# Patient Record
Sex: Female | Born: 1965 | Race: Black or African American | Hispanic: No | Smoking: Never smoker
Health system: Southern US, Community
[De-identification: ages and names within clinical notes are randomized; demographics above are authoritative.]

## PROBLEM LIST (undated history)

## (undated) DIAGNOSIS — N289 Disorder of kidney and ureter, unspecified: Secondary | ICD-10-CM

## (undated) DIAGNOSIS — I10 Essential (primary) hypertension: Secondary | ICD-10-CM

## (undated) DIAGNOSIS — J181 Lobar pneumonia, unspecified organism: Secondary | ICD-10-CM

## (undated) DIAGNOSIS — F419 Anxiety disorder, unspecified: Secondary | ICD-10-CM

## (undated) DIAGNOSIS — IMO0002 Reserved for concepts with insufficient information to code with codable children: Secondary | ICD-10-CM

## (undated) DIAGNOSIS — N183 Chronic kidney disease, stage 3 unspecified: Secondary | ICD-10-CM

## (undated) DIAGNOSIS — M5134 Other intervertebral disc degeneration, thoracic region: Secondary | ICD-10-CM

## (undated) DIAGNOSIS — M1990 Unspecified osteoarthritis, unspecified site: Secondary | ICD-10-CM

## (undated) DIAGNOSIS — I509 Heart failure, unspecified: Secondary | ICD-10-CM

## (undated) DIAGNOSIS — J9621 Acute and chronic respiratory failure with hypoxia: Secondary | ICD-10-CM

## (undated) DIAGNOSIS — I639 Cerebral infarction, unspecified: Secondary | ICD-10-CM

## (undated) DIAGNOSIS — I5032 Chronic diastolic (congestive) heart failure: Secondary | ICD-10-CM

## (undated) DIAGNOSIS — J449 Chronic obstructive pulmonary disease, unspecified: Secondary | ICD-10-CM

## (undated) DIAGNOSIS — G629 Polyneuropathy, unspecified: Secondary | ICD-10-CM

## (undated) HISTORY — DX: Chronic diastolic (congestive) heart failure: I50.32

## (undated) HISTORY — DX: Chronic obstructive pulmonary disease, unspecified: J44.9

## (undated) HISTORY — DX: Chronic kidney disease, stage 3 (moderate): N18.3

## (undated) HISTORY — PX: EYE SURGERY: SHX253

## (undated) HISTORY — DX: Acute and chronic respiratory failure with hypoxia: J96.21

## (undated) HISTORY — PX: THYROID SURGERY: SHX805

## (undated) HISTORY — PX: ABDOMINAL HYSTERECTOMY: SHX81

## (undated) HISTORY — PX: OTHER SURGICAL HISTORY: SHX169

## (undated) HISTORY — DX: Lobar pneumonia, unspecified organism: J18.1

## (undated) HISTORY — DX: Chronic kidney disease, stage 3 unspecified: N18.30

## (undated) HISTORY — PX: CHOLECYSTECTOMY: SHX55

---

## 2011-06-01 ENCOUNTER — Emergency Department (HOSPITAL_COMMUNITY)
Admission: EM | Admit: 2011-06-01 | Discharge: 2011-06-01 | Disposition: A | Discharge status: Home / Self Care | Payer: Self-pay | Attending: Emergency Medicine | Admitting: Emergency Medicine

## 2011-06-01 ENCOUNTER — Other Ambulatory Visit: Payer: Self-pay

## 2011-06-01 ENCOUNTER — Emergency Department (HOSPITAL_COMMUNITY): Payer: Self-pay

## 2011-06-01 DIAGNOSIS — J189 Pneumonia, unspecified organism: Secondary | ICD-10-CM | POA: Insufficient documentation

## 2011-06-01 DIAGNOSIS — I10 Essential (primary) hypertension: Secondary | ICD-10-CM | POA: Insufficient documentation

## 2011-06-01 DIAGNOSIS — R609 Edema, unspecified: Secondary | ICD-10-CM | POA: Insufficient documentation

## 2011-06-01 DIAGNOSIS — E119 Type 2 diabetes mellitus without complications: Secondary | ICD-10-CM | POA: Insufficient documentation

## 2011-06-01 DIAGNOSIS — Z9079 Acquired absence of other genital organ(s): Secondary | ICD-10-CM | POA: Insufficient documentation

## 2011-06-01 DIAGNOSIS — IMO0002 Reserved for concepts with insufficient information to code with codable children: Secondary | ICD-10-CM | POA: Insufficient documentation

## 2011-06-01 DIAGNOSIS — R600 Localized edema: Secondary | ICD-10-CM

## 2011-06-01 DIAGNOSIS — Z9889 Other specified postprocedural states: Secondary | ICD-10-CM | POA: Insufficient documentation

## 2011-06-01 DIAGNOSIS — F411 Generalized anxiety disorder: Secondary | ICD-10-CM | POA: Insufficient documentation

## 2011-06-01 DIAGNOSIS — M1990 Unspecified osteoarthritis, unspecified site: Secondary | ICD-10-CM | POA: Insufficient documentation

## 2011-06-01 HISTORY — DX: Polyneuropathy, unspecified: G62.9

## 2011-06-01 HISTORY — DX: Reserved for concepts with insufficient information to code with codable children: IMO0002

## 2011-06-01 HISTORY — DX: Unspecified osteoarthritis, unspecified site: M19.90

## 2011-06-01 HISTORY — DX: Essential (primary) hypertension: I10

## 2011-06-01 HISTORY — DX: Anxiety disorder, unspecified: F41.9

## 2011-06-01 LAB — URINALYSIS, ROUTINE W REFLEX MICROSCOPIC
Glucose, UA: 500 mg/dL — AB
Hgb urine dipstick: NEGATIVE
Ketones, ur: NEGATIVE mg/dL
Protein, ur: NEGATIVE mg/dL

## 2011-06-01 LAB — COMPREHENSIVE METABOLIC PANEL
ALT: 23 U/L (ref 0–35)
CO2: 33 mEq/L — ABNORMAL HIGH (ref 19–32)
Calcium: 9.7 mg/dL (ref 8.4–10.5)
GFR calc Af Amer: 86 mL/min — ABNORMAL LOW (ref >90)
GFR calc non Af Amer: 74 mL/min — ABNORMAL LOW (ref >90)
Glucose, Bld: 360 mg/dL — ABNORMAL HIGH (ref 70–99)
Sodium: 135 mEq/L (ref 135–145)

## 2011-06-01 LAB — DIFFERENTIAL
Basophils Absolute: 0 10*3/uL (ref 0.0–0.1)
Basophils Relative: 0 % (ref 0–1)
Neutro Abs: 2.6 10*3/uL (ref 1.7–7.7)
Neutrophils Relative %: 42 % — ABNORMAL LOW (ref 43–77)

## 2011-06-01 LAB — CBC
Hemoglobin: 9.3 g/dL — ABNORMAL LOW (ref 12.0–15.0)
MCHC: 32.3 g/dL (ref 30.0–36.0)
Platelets: 288 10*3/uL (ref 150–400)
RDW: 14.2 % (ref 11.5–15.5)

## 2011-06-01 LAB — CK: Total CK: 536 U/L — ABNORMAL HIGH (ref 7–177)

## 2011-06-01 MED ORDER — ONDANSETRON 8 MG PO TBDP
ORAL_TABLET | ORAL | Status: AC
  Filled 2011-06-01: qty 1

## 2011-06-01 MED ORDER — ONDANSETRON 8 MG PO TBDP
8.0000 mg | ORAL_TABLET | Freq: Once | ORAL | Status: AC
  Administered 2011-06-01: 8 mg via ORAL

## 2011-06-01 MED ORDER — AZITHROMYCIN 250 MG PO TABS: 250.0000 mg | ORAL_TABLET | Freq: Every day | ORAL | Status: DC

## 2011-06-01 MED ORDER — FUROSEMIDE 40 MG PO TABS: 40.0000 mg | ORAL_TABLET | Freq: Every day | ORAL | Status: DC

## 2011-06-01 NOTE — ED Provider Notes (Signed)
Scribed for Blanchie Dessert, MD, the patient was seen in room [facility]10/[facility]10 . This chart was scribed by Glory Buff.   CSN: [id number] Arrival date & time: 06/01/2011  6:18 PM   First MD Initiated Contact with Patient 06/01/11 1828      Chief Complaint  Patient presents with  . Leg Swelling  . Foot Swelling    (Consider location/radiation/quality/duration/timing/severity/associated sxs/prior treatment) HPI Joy Yoder is a 45 y.o. female who presents to the Emergency Department complaining of swelling to her bilateral lower extremities and abdomen starting about 3 weeks ago. Swelling started in her abdomen and spread to her bilateral lower extremities 1 week ago. Swelling has become progressively worse. Swelling is described as severe and constant. PT says she has gained  ~20lbs in the past 2.5 weeks. Pt reports multiple PMD and ED ([facility]) visits in the past week. Pt says she was placed on lasix but was told to stop taking lasix by her PCP, she is unsure why. Pt was recently placed on Provocol and stopped 1 week ago. Pt had a morphine pump placed 05/16/2011, swelling started prior to that.  No history of similar symptoms.  Denies SOB. No known heart problems.  No known liver problems. No recent fever. Some chills/hot flashes.  PCP in [place]  Past Medical History  Diagnosis Date  . Neuropathy   . DDD (degenerative disc disease)   . DJD (degenerative joint disease)   . Diabetes mellitus   . Hypertension   . Anxiety     Past Surgical History  Procedure Date  . Abdominal hysterectomy   . Cholecystectomy     History reviewed. No pertinent family history.  History  Substance Use Topics  . Smoking status: Never Smoker   . Smokeless tobacco: Not on file  . Alcohol Use: Yes     occ    Review of Systems 10 Systems reviewed and are negative for acute change except as noted in the HPI.   Allergies  Review of patient's allergies indicates no known allergies.  Home  Medications   Current Outpatient Rx  Name Route Sig Dispense Refill  . AMITRIPTYLINE HCL 25 MG PO TABS Oral Take 50 mg by mouth at bedtime.      . ASPIRIN EC 81 MG PO TBEC Oral Take 81 mg by mouth daily.      . BUSPIRONE HCL 10 MG PO TABS Oral Take 10 mg by mouth 3 (three) times daily.      . DEXLANSOPRAZOLE 60 MG PO CPDR Oral Take 60 mg by mouth daily.      . DULOXETINE HCL 60 MG PO CPEP Oral Take 60 mg by mouth 2 (two) times daily.      Marland Kitchen FERROUS SULFATE 325 (65 FE) MG PO TABS Oral Take 325 mg by mouth daily.      Marland Kitchen GABAPENTIN 300 MG PO CAPS Oral Take 300 mg by mouth 3 (three) times daily.      . INSULIN GLARGINE 100 UNIT/ML [place] SOLN Subcutaneous Inject 45 Units into the skin at bedtime.      . INSULIN LISPRO (HUMAN) 100 UNIT/ML [place] SOLN Subcutaneous Inject 12 Units into the skin 3 (three) times daily before meals.      Marland Kitchen LISINOPRIL-HYDROCHLOROTHIAZIDE 10-12.5 MG PO TABS Oral Take 1 tablet by mouth daily.      Marland Kitchen SAXAGLIPTIN HCL 5 MG PO TABS Oral Take 5 mg by mouth daily.        BP 134/73  Pulse 97  Temp(Src)  97.7 F (36.5 C) (Oral)  Resp 18  Ht 5\' 1"  (1.549 m)  Wt 180 lb (81.647 kg)  BMI 34.01 kg/m2  SpO2 99%  Physical Exam  Nursing note and vitals reviewed. Constitutional: She is oriented to person, place, and time. She appears well-developed and well-nourished.  HENT:  Head: Normocephalic and atraumatic.  Eyes: Conjunctivae are normal. No scleral icterus.  Neck: Neck supple.  Cardiovascular: Normal rate, regular rhythm, normal heart sounds and intact distal pulses.   Pulmonary/Chest: Effort normal and breath sounds normal. She has no rales.  Abdominal: Soft. Distention: pitting edema lower abdomen. There is no tenderness.       Healing surgical scare in right abdomen and lower lumbar spine. No signs of infection- no erythema or warmth.  Diffuse ascites  Musculoskeletal: Normal range of motion. She exhibits edema (4 + pitting edema BLE).  Neurological: She is alert and  oriented to person, place, and time.  Skin: Skin is warm and dry.  Psychiatric: She has a normal mood and affect.    ED Course  Procedures (including critical care time)  OTHER DATA REVIEWED: Nursing notes, vital signs, and past medical records reviewed.   DIAGNOSTIC STUDIES: Oxygen Saturation is 99% on room air, normal by my interpretation.    Results for orders placed during the hospital encounter of 06/01/11  URINALYSIS, ROUTINE W REFLEX MICROSCOPIC      Component Value Range   Color, Urine YELLOW  YELLOW    Appearance CLEAR  CLEAR    Specific Gravity, Urine 1.015  1.005 - 1.030    pH 7.0  5.0 - 8.0    Glucose, UA 500 (*) NEGATIVE (mg/dL)   Hgb urine dipstick NEGATIVE  NEGATIVE    Bilirubin Urine NEGATIVE  NEGATIVE    Ketones, ur NEGATIVE  NEGATIVE (mg/dL)   Protein, ur NEGATIVE  NEGATIVE (mg/dL)   Urobilinogen, UA 0.2  0.0 - 1.0 (mg/dL)   Nitrite NEGATIVE  NEGATIVE    Leukocytes, UA NEGATIVE  NEGATIVE   COMPREHENSIVE METABOLIC PANEL      Component Value Range   Sodium 135  135 - 145 (mEq/L)   Potassium 4.0  3.5 - 5.1 (mEq/L)   Chloride 96  96 - 112 (mEq/L)   CO2 33 (*) 19 - 32 (mEq/L)   Glucose, Bld 360 (*) 70 - 99 (mg/dL)   BUN 10  6 - 23 (mg/dL)   Creatinine, Ser 0.91  0.50 - 1.10 (mg/dL)   Calcium 9.7  8.4 - 10.5 (mg/dL)   Total Protein 6.8  6.0 - 8.3 (g/dL)   Albumin 3.2 (*) 3.5 - 5.2 (g/dL)   AST 21  0 - 37 (U/L)   ALT 23  0 - 35 (U/L)   Alkaline Phosphatase 92  39 - 117 (U/L)   Total Bilirubin 0.1 (*) 0.3 - 1.2 (mg/dL)   GFR calc non Af Amer 74 (*) >90 (mL/min)   GFR calc Af Amer 86 (*) >90 (mL/min)  CBC      Component Value Range   WBC 6.2  4.0 - 10.5 (K/uL)   RBC 3.54 (*) 3.87 - 5.11 (MIL/uL)   Hemoglobin 9.3 (*) 12.0 - 15.0 (g/dL)   HCT 28.8 (*) 36.0 - 46.0 (%)   MCV 81.4  78.0 - 100.0 (fL)   MCH 26.3  26.0 - 34.0 (pg)   MCHC 32.3  30.0 - 36.0 (g/dL)   RDW 14.2  11.5 - 15.5 (%)   Platelets 288  150 - 400 (K/uL)  DIFFERENTIAL      Component  Value Range   Neutrophils Relative 42 (*) 43 - 77 (%)   Neutro Abs 2.6  1.7 - 7.7 (K/uL)   Lymphocytes Relative 47 (*) 12 - 46 (%)   Lymphs Abs 2.9  0.7 - 4.0 (K/uL)   Monocytes Relative 9  3 - 12 (%)   Monocytes Absolute 0.6  0.1 - 1.0 (K/uL)   Eosinophils Relative 2  0 - 5 (%)   Eosinophils Absolute 0.1  0.0 - 0.7 (K/uL)   Basophils Relative 0  0 - 1 (%)   Basophils Absolute 0.0  0.0 - 0.1 (K/uL)  CK      Component Value Range   Total CK 536 (*) 7 - 177 (U/L)   Dg Chest 2 View  06/01/2011  *RADIOLOGY REPORT*  Clinical Data: Fever.  Chest pain.  Short of breath.  Weakness.  CHEST - 2 VIEW  Comparison: None.  Findings: Patchy right lower lobe density is present on the frontal and lateral views suspicious for pneumonia.  Left lung appears clear. Cholecystectomy clips are present in the right upper quadrant.  Trachea midline.  Cardiopericardial silhouette within normal limits. Follow-up to ensure radiographic clearing recommended.  Clearing is usually observed at 4-6 weeks.  IMPRESSION: Patchy right lower lobe density compatible with pneumonia.  Original Report Authenticated By: Dereck Ligas, M.D.     No diagnosis found.   Date: 06/01/2011  Rate: 95  Rhythm: normal sinus rhythm  QRS Axis: normal  Intervals: normal  ST/T Wave abnormalities: normal  Conduction Disutrbances:none  Narrative Interpretation:   Old EKG Reviewed: none available     MDM   Pt with MMP who recently had a morphine pump placed but started to develop swelling before pump inserted.  Pt has notable edema in the bilateral lower ext up to the abd.  Pt denies prior hx of this and no heart hx.  Pt has had duplex scans of bilateral lower ext and no seen her PCP who started her on lasix which she took for 2 days and then the PCP told her to stop.  Pt was on pravachol which she started 3 weeks ago before the swelling and stopped 1 week ago.  Pt has gained about 20lbs of fluid in the last few weeks with some mild SOB  but no rales on exam.  No signs of infection.  No hx of liver disease.  Will check labs for electrolyte and renal disturbance.  Also will check albumin and CK.   7:59 PM  Pt with no signs of electrolyte derangement and normal renal function.  Hyperglycemia but difficult to control diabetic.  Liver is wnl and normal albumin.  Feel pt show try a longer course of lasix and f/u with PCP.  EKG wnl and CXR without signs of edema but possible early PNA and will treat.  CK without signs of rhabdo from pravachol.  9:00 PM.  Will recommend that pt restart the lasix and elevate the legs and see her doctor in the next few days for close follow up.  I personally performed the services described in this documentation, which was scribed in my presence.  The recorded information has been reviewed and considered.    Blanchie Dessert, MD 06/01/11 [id number]

## 2011-06-01 NOTE — ED Notes (Signed)
Pt presents with swelling in bilateral legs, feet, and abdomin. Pt saw PMD and was started on cholesterol medication and lasix.

## 2011-06-01 NOTE — ED Notes (Signed)
Rx given x2 D/c instructions reviewed w/ pt and family - pt and family deny any further questions or concerns at present.

## 2011-06-07 ENCOUNTER — Encounter (HOSPITAL_COMMUNITY): Payer: Self-pay

## 2011-06-07 ENCOUNTER — Other Ambulatory Visit: Payer: Self-pay

## 2011-06-07 ENCOUNTER — Emergency Department (HOSPITAL_COMMUNITY)
Admission: EM | Admit: 2011-06-07 | Discharge: 2011-06-07 | Disposition: A | Discharge status: Home / Self Care | Payer: Self-pay | Attending: Emergency Medicine | Admitting: Emergency Medicine

## 2011-06-07 ENCOUNTER — Emergency Department (HOSPITAL_COMMUNITY): Payer: Self-pay

## 2011-06-07 DIAGNOSIS — Z9079 Acquired absence of other genital organ(s): Secondary | ICD-10-CM | POA: Insufficient documentation

## 2011-06-07 DIAGNOSIS — D638 Anemia in other chronic diseases classified elsewhere: Secondary | ICD-10-CM | POA: Insufficient documentation

## 2011-06-07 DIAGNOSIS — I10 Essential (primary) hypertension: Secondary | ICD-10-CM | POA: Insufficient documentation

## 2011-06-07 DIAGNOSIS — Z9889 Other specified postprocedural states: Secondary | ICD-10-CM | POA: Insufficient documentation

## 2011-06-07 DIAGNOSIS — IMO0002 Reserved for concepts with insufficient information to code with codable children: Secondary | ICD-10-CM | POA: Insufficient documentation

## 2011-06-07 DIAGNOSIS — M1990 Unspecified osteoarthritis, unspecified site: Secondary | ICD-10-CM | POA: Insufficient documentation

## 2011-06-07 DIAGNOSIS — G589 Mononeuropathy, unspecified: Secondary | ICD-10-CM | POA: Insufficient documentation

## 2011-06-07 DIAGNOSIS — E119 Type 2 diabetes mellitus without complications: Secondary | ICD-10-CM | POA: Insufficient documentation

## 2011-06-07 DIAGNOSIS — F411 Generalized anxiety disorder: Secondary | ICD-10-CM | POA: Insufficient documentation

## 2011-06-07 DIAGNOSIS — R609 Edema, unspecified: Secondary | ICD-10-CM | POA: Insufficient documentation

## 2011-06-07 LAB — URINALYSIS, ROUTINE W REFLEX MICROSCOPIC
Bilirubin Urine: NEGATIVE
Hgb urine dipstick: NEGATIVE
Ketones, ur: NEGATIVE mg/dL
Nitrite: NEGATIVE
pH: 6.5 (ref 5.0–8.0)

## 2011-06-07 LAB — CBC
Platelets: 319 10*3/uL (ref 150–400)
RBC: 3.82 MIL/uL — ABNORMAL LOW (ref 3.87–5.11)
WBC: 6.4 10*3/uL (ref 4.0–10.5)

## 2011-06-07 LAB — COMPREHENSIVE METABOLIC PANEL
ALT: 22 U/L (ref 0–35)
AST: 20 U/L (ref 0–37)
Alkaline Phosphatase: 84 U/L (ref 39–117)
CO2: 32 mEq/L (ref 19–32)
GFR calc Af Amer: >90 mL/min (ref >90)
GFR calc non Af Amer: >90 mL/min (ref >90)
Glucose, Bld: 347 mg/dL — ABNORMAL HIGH (ref 70–99)
Potassium: 4.2 mEq/L (ref 3.5–5.1)
Sodium: 135 mEq/L (ref 135–145)
Total Protein: 7.4 g/dL (ref 6.0–8.3)

## 2011-06-07 LAB — DIFFERENTIAL
Basophils Absolute: 0 10*3/uL (ref 0.0–0.1)
Lymphocytes Relative: 32 % (ref 12–46)
Lymphs Abs: 2.1 10*3/uL (ref 0.7–4.0)
Neutrophils Relative %: 57 % (ref 43–77)

## 2011-06-07 LAB — PRO B NATRIURETIC PEPTIDE: Pro B Natriuretic peptide (BNP): 153.8 pg/mL — ABNORMAL HIGH (ref 0–125)

## 2011-06-07 LAB — POCT I-STAT TROPONIN I: Troponin i, poc: 0 ng/mL (ref 0.00–0.08)

## 2011-06-07 MED ORDER — ONDANSETRON 8 MG PO TBDP
8.0000 mg | ORAL_TABLET | Freq: Once | ORAL | Status: AC
  Administered 2011-06-07: 8 mg via ORAL
  Filled 2011-06-07: qty 1

## 2011-06-07 NOTE — ED Notes (Signed)
Pt presents with bilateral lower leg swelling and anxiety attack. Pt was seen here last Sunday for same and states she followed up with PMD. Pt states PMD instructed her to continue taking Lasix. Bilateral feet swollen. No weeping noted.

## 2011-06-07 NOTE — ED Provider Notes (Signed)
History     CSN: [id number] Arrival date & time: 06/07/2011 12:16 PM    Chief Complaint  Patient presents with  . Leg Swelling    HPI Pt was seen at 1340.  Per pt and family, c/o gradual onset and persistence of constant bilateral lower legs "swelling" x1+ months.  Pt has been eval in multiple ED's and PMD's ofc for same, currently taking lasix but non-compliant with wearing support stockings.  States she is "not any better" and is here to be admitted.  States she had a follow up appointment with her PMD 3 days ago who instructed her to continue her lasix.  Denies abd pain, no fevers, no CP/SOB, no rash, no injury, no focal motor weakness, no tingling/numbness in extremities.    Past Medical History  Diagnosis Date  . Neuropathy   . DDD (degenerative disc disease)   . DJD (degenerative joint disease)   . Diabetes mellitus   . Hypertension   . Anxiety     Past Surgical History  Procedure Date  . Abdominal hysterectomy   . Cholecystectomy     Social History  . Marital Status: Single   Social History Main Topics  . Smoking status: Never Smoker   . Smokeless tobacco: None  . Alcohol Use: Yes     occ  . Drug Use: No   Review of Systems ROS: Statement: All systems negative except as marked or noted in the HPI; Constitutional: Negative for fever and chills. ; ; Eyes: Negative for eye pain, redness and discharge. ; ; ENMT: Negative for ear pain, hoarseness, nasal congestion, sinus pressure and sore throat. ; ; Cardiovascular: Negative for chest pain, palpitations, diaphoresis, dyspnea and +peripheral edema. ; ; Respiratory: Negative for cough, wheezing and stridor. ; ; Gastrointestinal: Negative for nausea, vomiting, diarrhea and abdominal pain, blood in stool, hematemesis, jaundice and rectal bleeding. . ; ; Genitourinary: Negative for dysuria, flank pain and hematuria. ; ; Musculoskeletal: Negative for back pain and neck pain. Negative for swelling and trauma.; ; Skin: Negative for  pruritus, rash, abrasions, blisters, bruising and skin lesion.; ; Neuro: Negative for headache, lightheadedness and neck stiffness. Negative for weakness, altered level of consciousness , altered mental status, extremity weakness, paresthesias, involuntary movement, seizure and syncope.    Allergies  Review of patient's allergies indicates no known allergies.  Home Medications   Current Outpatient Rx  Name Route Sig Dispense Refill  . AMITRIPTYLINE HCL 25 MG PO TABS Oral Take 50 mg by mouth at bedtime.      . ASPIRIN EC 81 MG PO TBEC Oral Take 81 mg by mouth daily.      . AZITHROMYCIN 250 MG PO TABS Oral Take 1 tablet (250 mg total) by mouth daily. 4 tablet 0  . BUSPIRONE HCL 10 MG PO TABS Oral Take 10 mg by mouth 3 (three) times daily.      . DEXLANSOPRAZOLE 60 MG PO CPDR Oral Take 60 mg by mouth daily.      . DULOXETINE HCL 60 MG PO CPEP Oral Take 60 mg by mouth 2 (two) times daily.      Marland Kitchen FERROUS SULFATE 325 (65 FE) MG PO TABS Oral Take 325 mg by mouth daily.      . FUROSEMIDE 40 MG PO TABS Oral Take 1 tablet (40 mg total) by mouth daily. 10 tablet 0  . GABAPENTIN 300 MG PO CAPS Oral Take 300 mg by mouth 3 (three) times daily.      Marland Kitchen  INSULIN GLARGINE 100 UNIT/ML [place] SOLN Subcutaneous Inject 45 Units into the skin at bedtime.      . INSULIN LISPRO (HUMAN) 100 UNIT/ML [place] SOLN Subcutaneous Inject 12 Units into the skin 3 (three) times daily before meals.      Marland Kitchen LISINOPRIL-HYDROCHLOROTHIAZIDE 10-12.5 MG PO TABS Oral Take 1 tablet by mouth daily.      Marland Kitchen SAXAGLIPTIN HCL 5 MG PO TABS Oral Take 5 mg by mouth daily.        BP 146/61  Pulse 87  Temp(Src) 98.6 F (37 C) (Oral)  Resp 18  Ht 5\' 1"  (1.549 m)  Wt 180 lb (81.647 kg)  BMI 34.01 kg/m2  SpO2 99%  Physical Exam 1345: Physical examination:  Nursing notes reviewed; Vital signs and O2 SAT reviewed;  Constitutional: Well developed, Well nourished, Well hydrated, In no acute distress; Head:  Normocephalic, atraumatic; Eyes: EOMI, PERRL,  No scleral icterus; ENMT: Mouth and pharynx normal, Mucous membranes moist; Neck: Supple, Full range of motion, No lymphadenopathy; Cardiovascular: Regular rate and rhythm, No murmur, rub, or gallop; Respiratory: Breath sounds clear & equal bilaterally, No rales, rhonchi, wheezes, or rub, Normal respiratory effort/excursion; Chest: Nontender, Movement normal; Abdomen: Soft, Nontender, Nondistended, Normal bowel sounds; Extremities: Pulses normal, No tenderness, 2+ pedal edema bilat, No calf asymmetry. No rash.; Neuro: AA&Ox3, Major CN grossly intact.  No gross focal motor or sensory deficits in extremities.; Skin: Color normal, Warm, Dry, no rash.    ED Course  Procedures   1350:  Pt and family insistent they were "told by that last ED doctor" to "come back to the ED in a week and she would call a Cardiologist to see me in the Emergency Department and admit me."  Previous ED record reviewed:  Pedal edema improved today compared with description of edema on that exam; no mention of admission/Cards eval in ED, only to f/u with her PMD and tx with zithromax for poss early pneumonia on CXR.  Pt and family state "that's a lie, we were promised she was going to be admitted."  Do agree that pt did take her full course of abx.  Long d/w pt and family regarding the need for criteria for admission, of which pt's complaint today is solely "my legs are still swollen" persistently for the past 1+ months.  Apparently pt has had multiple ED visits to [facility] for same, as well as her PMD, with negative work up for DVT, kidney and liver dysfunction.  Pt and family repeats that "no one is doing anything" and she "needs to be admitted." Informed family that I will check her CXR, EKG, UA and labs today, but cannot promise admission. Pt and family verb understanding.     MDM  MDM Reviewed: previous chart, nursing note and vitals Reviewed previous: labs, ECG and x-ray Interpretation: labs, ECG and x-ray     Date: 06/07/2011  Rate: 85  Rhythm: normal sinus rhythm  QRS Axis: normal  Intervals: normal  ST/T Wave abnormalities: normal  Conduction Disutrbances:none  Narrative Interpretation:   Old EKG Reviewed: none available  POC troponin 0.00 (did not cross over into EPIC chart)  Results for orders placed during the hospital encounter of 06/07/11  CBC      Component Value Range   WBC 6.4  4.0 - 10.5 (K/uL)   RBC 3.82 (*) 3.87 - 5.11 (MIL/uL)   Hemoglobin 10.0 (*) 12.0 - 15.0 (g/dL)   HCT 31.3 (*) 36.0 - 46.0 (%)  MCV 81.9  78.0 - 100.0 (fL)   MCH 26.2  26.0 - 34.0 (pg)   MCHC 31.9  30.0 - 36.0 (g/dL)   RDW 14.6  11.5 - 15.5 (%)   Platelets 319  150 - 400 (K/uL)  DIFFERENTIAL      Component Value Range   Neutrophils Relative 57  43 - 77 (%)   Neutro Abs 3.6  1.7 - 7.7 (K/uL)   Lymphocytes Relative 32  12 - 46 (%)   Lymphs Abs 2.1  0.7 - 4.0 (K/uL)   Monocytes Relative 9  3 - 12 (%)   Monocytes Absolute 0.6  0.1 - 1.0 (K/uL)   Eosinophils Relative 2  0 - 5 (%)   Eosinophils Absolute 0.1  0.0 - 0.7 (K/uL)   Basophils Relative 0  0 - 1 (%)   Basophils Absolute 0.0  0.0 - 0.1 (K/uL)  COMPREHENSIVE METABOLIC PANEL      Component Value Range   Sodium 135  135 - 145 (mEq/L)   Potassium 4.2  3.5 - 5.1 (mEq/L)   Chloride 96  96 - 112 (mEq/L)   CO2 32  19 - 32 (mEq/L)   Glucose, Bld 347 (*) 70 - 99 (mg/dL)   BUN 9  6 - 23 (mg/dL)   Creatinine, Ser 0.73  0.50 - 1.10 (mg/dL)   Calcium 10.0  8.4 - 10.5 (mg/dL)   Total Protein 7.4  6.0 - 8.3 (g/dL)   Albumin 3.4 (*) 3.5 - 5.2 (g/dL)   AST 20  0 - 37 (U/L)   ALT 22  0 - 35 (U/L)   Alkaline Phosphatase 84  39 - 117 (U/L)   Total Bilirubin 0.2 (*) 0.3 - 1.2 (mg/dL)   GFR calc non Af Amer >90  >90 (mL/min)   GFR calc Af Amer >90  >90 (mL/min)  URINALYSIS, ROUTINE W REFLEX MICROSCOPIC      Component Value Range   Color, Urine YELLOW  YELLOW    Appearance CLEAR  CLEAR    Specific Gravity, Urine 1.010  1.005 - 1.030    pH 6.5  5.0 -  8.0    Glucose, UA 500 (*) NEGATIVE (mg/dL)   Hgb urine dipstick NEGATIVE  NEGATIVE    Bilirubin Urine NEGATIVE  NEGATIVE    Ketones, ur NEGATIVE  NEGATIVE (mg/dL)   Protein, ur NEGATIVE  NEGATIVE (mg/dL)   Urobilinogen, UA 0.2  0.0 - 1.0 (mg/dL)   Nitrite NEGATIVE  NEGATIVE    Leukocytes, UA NEGATIVE  NEGATIVE   PRO B NATRIURETIC PEPTIDE      Component Value Range   BNP, POC 153.8 (*) 0 - 125 (pg/mL)  POCT I-STAT TROPONIN I      Component Value Range   Troponin i, poc 0.00  0.00 - 0.08 (ng/mL)   Comment 3            Dg Chest 2 View  06/07/2011  *RADIOLOGY REPORT*  Clinical Data: Pneumonia, peripheral edema  CHEST - 2 VIEW  Comparison:  06/01/2011  Findings:  The heart size and mediastinal contours are within normal limits.  Both lungs are clear.  The visualized skeletal structures are unremarkable.  IMPRESSION: No active cardiopulmonary disease.  Original Report Authenticated By: Jerilynn Mages. Daryll Brod, M.D.   Results for ALPHONSO, BIELAK (MRN [id number]) as of 06/07/2011 16:07  Ref. Range 06/01/2011 18:59 06/07/2011 14:13  HGB Latest Range: 12.0-15.0 g/dL 9.3 (L) 10.0 (L)  HCT Latest Range: 36.0-46.0 % 28.8 (L) 31.3 (L)  4:08 PM:  Anemia is chronic.  RLL infiltrate on previous CXR resolved today.  No CHF on CXR, BNP mildly elevated but no overt symptoms of CHF today.  Glucose elevated per pt's hx of DM, not acidotic.  VSS.  Pt has ambulated from ED exam room, to BR, and in XR dept with steady gait with her cane per her norm without SOB/CP.  No criteria for admission at this time.  Long d/w re: dx testing d/w pt and family.  Questions answered.  Verb understanding, agreeable to d/c home with outpt f/u.  Pt encouraged to continue her lasix and wear her support hose daily, f/u with PMD this week.           [place], DO 06/09/11 1307

## 2011-06-08 LAB — URINE CULTURE
Culture  Setup Time: 201211032232
Culture: NO GROWTH

## 2017-07-04 ENCOUNTER — Emergency Department (HOSPITAL_COMMUNITY): Payer: Medicare Other

## 2017-07-04 ENCOUNTER — Encounter (HOSPITAL_COMMUNITY): Payer: Self-pay | Admitting: *Deleted

## 2017-07-04 ENCOUNTER — Inpatient Hospital Stay (HOSPITAL_COMMUNITY)
Admission: EM | Admit: 2017-07-04 | Discharge: 2017-07-07 | DRG: 896 | Disposition: A | Discharge status: Home / Self Care | Payer: Medicare Other | Attending: Internal Medicine | Admitting: Internal Medicine

## 2017-07-04 ENCOUNTER — Other Ambulatory Visit: Payer: Self-pay

## 2017-07-04 ENCOUNTER — Observation Stay (HOSPITAL_COMMUNITY): Payer: Medicare Other

## 2017-07-04 DIAGNOSIS — E1122 Type 2 diabetes mellitus with diabetic chronic kidney disease: Secondary | ICD-10-CM | POA: Diagnosis present

## 2017-07-04 DIAGNOSIS — Z794 Long term (current) use of insulin: Secondary | ICD-10-CM | POA: Diagnosis not present

## 2017-07-04 DIAGNOSIS — R52 Pain, unspecified: Secondary | ICD-10-CM | POA: Diagnosis present

## 2017-07-04 DIAGNOSIS — R109 Unspecified abdominal pain: Secondary | ICD-10-CM

## 2017-07-04 DIAGNOSIS — Z888 Allergy status to other drugs, medicaments and biological substances status: Secondary | ICD-10-CM

## 2017-07-04 DIAGNOSIS — N179 Acute kidney failure, unspecified: Secondary | ICD-10-CM

## 2017-07-04 DIAGNOSIS — E861 Hypovolemia: Secondary | ICD-10-CM | POA: Diagnosis present

## 2017-07-04 DIAGNOSIS — N183 Chronic kidney disease, stage 3 (moderate): Secondary | ICD-10-CM | POA: Diagnosis present

## 2017-07-04 DIAGNOSIS — Z886 Allergy status to analgesic agent status: Secondary | ICD-10-CM

## 2017-07-04 DIAGNOSIS — I509 Heart failure, unspecified: Secondary | ICD-10-CM | POA: Diagnosis present

## 2017-07-04 DIAGNOSIS — E119 Type 2 diabetes mellitus without complications: Secondary | ICD-10-CM

## 2017-07-04 DIAGNOSIS — M5134 Other intervertebral disc degeneration, thoracic region: Secondary | ICD-10-CM | POA: Diagnosis present

## 2017-07-04 DIAGNOSIS — Z7982 Long term (current) use of aspirin: Secondary | ICD-10-CM

## 2017-07-04 DIAGNOSIS — G894 Chronic pain syndrome: Secondary | ICD-10-CM | POA: Diagnosis present

## 2017-07-04 DIAGNOSIS — I10 Essential (primary) hypertension: Secondary | ICD-10-CM | POA: Diagnosis not present

## 2017-07-04 DIAGNOSIS — Z90710 Acquired absence of both cervix and uterus: Secondary | ICD-10-CM

## 2017-07-04 DIAGNOSIS — E860 Dehydration: Secondary | ICD-10-CM | POA: Diagnosis present

## 2017-07-04 DIAGNOSIS — E1121 Type 2 diabetes mellitus with diabetic nephropathy: Secondary | ICD-10-CM

## 2017-07-04 DIAGNOSIS — Z8673 Personal history of transient ischemic attack (TIA), and cerebral infarction without residual deficits: Secondary | ICD-10-CM

## 2017-07-04 DIAGNOSIS — E1165 Type 2 diabetes mellitus with hyperglycemia: Secondary | ICD-10-CM

## 2017-07-04 DIAGNOSIS — R1084 Generalized abdominal pain: Secondary | ICD-10-CM

## 2017-07-04 DIAGNOSIS — F1123 Opioid dependence with withdrawal: Principal | ICD-10-CM | POA: Diagnosis present

## 2017-07-04 DIAGNOSIS — M1990 Unspecified osteoarthritis, unspecified site: Secondary | ICD-10-CM | POA: Diagnosis present

## 2017-07-04 DIAGNOSIS — N170 Acute kidney failure with tubular necrosis: Secondary | ICD-10-CM | POA: Diagnosis present

## 2017-07-04 DIAGNOSIS — R112 Nausea with vomiting, unspecified: Secondary | ICD-10-CM | POA: Diagnosis present

## 2017-07-04 DIAGNOSIS — I130 Hypertensive heart and chronic kidney disease with heart failure and stage 1 through stage 4 chronic kidney disease, or unspecified chronic kidney disease: Secondary | ICD-10-CM | POA: Diagnosis present

## 2017-07-04 DIAGNOSIS — Z9049 Acquired absence of other specified parts of digestive tract: Secondary | ICD-10-CM

## 2017-07-04 HISTORY — DX: Heart failure, unspecified: I50.9

## 2017-07-04 HISTORY — DX: Cerebral infarction, unspecified: I63.9

## 2017-07-04 HISTORY — DX: Disorder of kidney and ureter, unspecified: N28.9

## 2017-07-04 HISTORY — DX: Other intervertebral disc degeneration, thoracic region: M51.34

## 2017-07-04 LAB — CBC WITH DIFFERENTIAL/PLATELET
BASOS ABS: 0 10*3/uL (ref 0.0–0.1)
BASOS PCT: 0 %
EOS PCT: 1 %
Eosinophils Absolute: 0.1 10*3/uL (ref 0.0–0.7)
HCT: 36 % (ref 36.0–46.0)
Hemoglobin: 10.9 g/dL — ABNORMAL LOW (ref 12.0–15.0)
LYMPHS PCT: 45 %
Lymphs Abs: 3.6 10*3/uL (ref 0.7–4.0)
MCH: 25.1 pg — ABNORMAL LOW (ref 26.0–34.0)
MCHC: 30.3 g/dL (ref 30.0–36.0)
MCV: 82.8 fL (ref 78.0–100.0)
MONO ABS: 0.8 10*3/uL (ref 0.1–1.0)
Monocytes Relative: 10 %
Neutro Abs: 3.5 10*3/uL (ref 1.7–7.7)
Neutrophils Relative %: 44 %
PLATELETS: 251 10*3/uL (ref 150–400)
RBC: 4.35 MIL/uL (ref 3.87–5.11)
RDW: 15.6 % — AB (ref 11.5–15.5)
WBC: 7.9 10*3/uL (ref 4.0–10.5)

## 2017-07-04 LAB — URINALYSIS, ROUTINE W REFLEX MICROSCOPIC
Bacteria, UA: NONE SEEN
Bilirubin Urine: NEGATIVE
GLUCOSE, UA: NEGATIVE mg/dL
Hgb urine dipstick: NEGATIVE
KETONES UR: NEGATIVE mg/dL
LEUKOCYTES UA: NEGATIVE
Nitrite: NEGATIVE
Protein, ur: 100 mg/dL — AB
Specific Gravity, Urine: 1.015 (ref 1.005–1.030)
pH: 7 (ref 5.0–8.0)

## 2017-07-04 LAB — BASIC METABOLIC PANEL
ANION GAP: 6 (ref 5–15)
BUN: 40 mg/dL — ABNORMAL HIGH (ref 6–20)
CO2: 23 mmol/L (ref 22–32)
Calcium: 9.7 mg/dL (ref 8.9–10.3)
Chloride: 112 mmol/L — ABNORMAL HIGH (ref 101–111)
Creatinine, Ser: 2.4 mg/dL — ABNORMAL HIGH (ref 0.44–1.00)
GFR calc Af Amer: 25 mL/min — ABNORMAL LOW (ref >60)
GFR, EST NON AFRICAN AMERICAN: 22 mL/min — AB (ref >60)
GLUCOSE: 112 mg/dL — AB (ref 65–99)
Potassium: 4.7 mmol/L (ref 3.5–5.1)
Sodium: 141 mmol/L (ref 135–145)

## 2017-07-04 LAB — BRAIN NATRIURETIC PEPTIDE: B NATRIURETIC PEPTIDE 5: 12 pg/mL (ref 0.0–100.0)

## 2017-07-04 LAB — INFLUENZA PANEL BY PCR (TYPE A & B)
INFLAPCR: NEGATIVE
INFLBPCR: NEGATIVE

## 2017-07-04 LAB — GLUCOSE, CAPILLARY: Glucose-Capillary: 239 mg/dL — ABNORMAL HIGH (ref 65–99)

## 2017-07-04 LAB — CREATININE, URINE, RANDOM: CREATININE, URINE: 133.88 mg/dL

## 2017-07-04 LAB — TROPONIN I: Troponin I: <0.03 ng/mL (ref <0.03)

## 2017-07-04 LAB — SODIUM, URINE, RANDOM: SODIUM UR: 86 mmol/L

## 2017-07-04 MED ORDER — MORPHINE SULFATE (PF) 4 MG/ML IV SOLN: 6.0000 mg | Freq: Once | INTRAVENOUS | Status: DC

## 2017-07-04 MED ORDER — PROMETHAZINE HCL 25 MG/ML IJ SOLN
12.5000 mg | Freq: Four times a day (QID) | INTRAMUSCULAR | Status: DC | PRN
  Administered 2017-07-04: 12.5 mg via INTRAMUSCULAR
  Filled 2017-07-04: qty 1

## 2017-07-04 MED ORDER — BUSPIRONE HCL 5 MG PO TABS
10.0000 mg | ORAL_TABLET | Freq: Three times a day (TID) | ORAL | Status: DC
  Administered 2017-07-05 – 2017-07-07 (×6): 10 mg via ORAL
  Filled 2017-07-04 (×7): qty 2

## 2017-07-04 MED ORDER — ACETAMINOPHEN 325 MG PO TABS: 650.0000 mg | ORAL_TABLET | Freq: Four times a day (QID) | ORAL | Status: DC | PRN

## 2017-07-04 MED ORDER — LISINOPRIL-HYDROCHLOROTHIAZIDE 10-12.5 MG PO TABS: 1.0000 | ORAL_TABLET | Freq: Every day | ORAL | Status: DC

## 2017-07-04 MED ORDER — ENOXAPARIN SODIUM 30 MG/0.3ML ~~LOC~~ SOLN
30.0000 mg | SUBCUTANEOUS | Status: DC
  Administered 2017-07-04: 30 mg via SUBCUTANEOUS
  Filled 2017-07-04: qty 0.3

## 2017-07-04 MED ORDER — ACETAMINOPHEN 650 MG RE SUPP: 650.0000 mg | Freq: Four times a day (QID) | RECTAL | Status: DC | PRN

## 2017-07-04 MED ORDER — SODIUM CHLORIDE 0.9 % IV SOLN: INTRAVENOUS | Status: AC

## 2017-07-04 MED ORDER — INSULIN ASPART 100 UNIT/ML ~~LOC~~ SOLN
0.0000 [IU] | Freq: Three times a day (TID) | SUBCUTANEOUS | Status: DC
  Administered 2017-07-05 (×3): 5 [IU] via SUBCUTANEOUS
  Administered 2017-07-06: 2 [IU] via SUBCUTANEOUS
  Administered 2017-07-06 (×2): 3 [IU] via SUBCUTANEOUS

## 2017-07-04 MED ORDER — SODIUM CHLORIDE 0.9 % IV BOLUS (SEPSIS)
1000.0000 mL | Freq: Once | INTRAVENOUS | Status: AC
Start: 2017-07-04 — End: 2017-07-04
  Administered 2017-07-04: 1000 mL via INTRAVENOUS

## 2017-07-04 MED ORDER — ONDANSETRON HCL 4 MG/2ML IJ SOLN
4.0000 mg | Freq: Four times a day (QID) | INTRAMUSCULAR | Status: DC | PRN
  Administered 2017-07-04 – 2017-07-05 (×4): 4 mg via INTRAVENOUS
  Filled 2017-07-04 (×4): qty 2

## 2017-07-04 MED ORDER — MORPHINE SULFATE (PF) 4 MG/ML IV SOLN
4.0000 mg | INTRAVENOUS | Status: DC | PRN
  Filled 2017-07-04: qty 1

## 2017-07-04 MED ORDER — MORPHINE SULFATE (PF) 4 MG/ML IV SOLN
4.0000 mg | Freq: Once | INTRAVENOUS | Status: AC
  Administered 2017-07-04: 4 mg via INTRAVENOUS
  Filled 2017-07-04: qty 1

## 2017-07-04 MED ORDER — HYDROMORPHONE HCL 1 MG/ML IJ SOLN
1.0000 mg | INTRAMUSCULAR | Status: DC | PRN
  Administered 2017-07-04 – 2017-07-06 (×18): 1 mg via INTRAVENOUS
  Filled 2017-07-04 (×19): qty 1

## 2017-07-04 MED ORDER — GABAPENTIN 300 MG PO CAPS
300.0000 mg | ORAL_CAPSULE | Freq: Three times a day (TID) | ORAL | Status: DC
  Administered 2017-07-05 – 2017-07-07 (×6): 300 mg via ORAL
  Filled 2017-07-04 (×7): qty 1

## 2017-07-04 MED ORDER — DIPHENHYDRAMINE HCL 50 MG/ML IJ SOLN
25.0000 mg | Freq: Once | INTRAMUSCULAR | Status: AC
  Administered 2017-07-04: 25 mg via INTRAVENOUS
  Filled 2017-07-04: qty 1

## 2017-07-04 MED ORDER — HYDROCHLOROTHIAZIDE 12.5 MG PO CAPS
12.5000 mg | ORAL_CAPSULE | Freq: Every day | ORAL | Status: DC
  Administered 2017-07-06: 12.5 mg via ORAL
  Filled 2017-07-04: qty 1

## 2017-07-04 MED ORDER — FENTANYL CITRATE (PF) 100 MCG/2ML IJ SOLN
25.0000 ug | Freq: Once | INTRAMUSCULAR | Status: AC
  Administered 2017-07-04: 25 ug via INTRAMUSCULAR
  Filled 2017-07-04: qty 2

## 2017-07-04 MED ORDER — LISINOPRIL 10 MG PO TABS: 10.0000 mg | ORAL_TABLET | Freq: Every day | ORAL | Status: DC

## 2017-07-04 MED ORDER — PROMETHAZINE HCL 25 MG/ML IJ SOLN
12.5000 mg | INTRAMUSCULAR | Status: DC | PRN
  Administered 2017-07-04 – 2017-07-07 (×13): 12.5 mg via INTRAVENOUS
  Filled 2017-07-04 (×13): qty 1

## 2017-07-04 MED ORDER — INSULIN GLARGINE 100 UNIT/ML ~~LOC~~ SOLN
45.0000 [IU] | Freq: Every day | SUBCUTANEOUS | Status: DC
  Administered 2017-07-04 – 2017-07-06 (×3): 45 [IU] via SUBCUTANEOUS
  Filled 2017-07-04 (×4): qty 0.45

## 2017-07-04 MED ORDER — HYDRALAZINE HCL 20 MG/ML IJ SOLN
5.0000 mg | INTRAMUSCULAR | Status: DC | PRN
  Administered 2017-07-04 – 2017-07-05 (×2): 5 mg via INTRAVENOUS
  Filled 2017-07-04 (×3): qty 1

## 2017-07-04 MED ORDER — ONDANSETRON HCL 4 MG/2ML IJ SOLN
4.0000 mg | Freq: Once | INTRAMUSCULAR | Status: AC
  Administered 2017-07-04: 4 mg via INTRAVENOUS
  Filled 2017-07-04: qty 2

## 2017-07-04 MED ORDER — POLYETHYLENE GLYCOL 3350 17 G PO PACK: 17.0000 g | PACK | Freq: Every day | ORAL | Status: DC | PRN

## 2017-07-04 MED ORDER — DULOXETINE HCL 60 MG PO CPEP
60.0000 mg | ORAL_CAPSULE | Freq: Two times a day (BID) | ORAL | Status: DC
  Administered 2017-07-05 – 2017-07-07 (×4): 60 mg via ORAL
  Filled 2017-07-04 (×5): qty 1

## 2017-07-04 MED ORDER — MORPHINE SULFATE (PF) 10 MG/ML IV SOLN
INTRAVENOUS | Status: AC
  Administered 2017-07-04: 6 mg
  Filled 2017-07-04: qty 1

## 2017-07-04 MED ORDER — PROMETHAZINE HCL 25 MG/ML IJ SOLN
12.5000 mg | Freq: Once | INTRAMUSCULAR | Status: AC
  Administered 2017-07-04: 12.5 mg via INTRAVENOUS
  Filled 2017-07-04: qty 1

## 2017-07-04 MED ORDER — FENTANYL CITRATE (PF) 100 MCG/2ML IJ SOLN
25.0000 ug | INTRAMUSCULAR | Status: DC | PRN
  Administered 2017-07-04: 25 ug via INTRAVENOUS

## 2017-07-04 NOTE — ED Provider Notes (Signed)
[facility] EMERGENCY DEPARTMENT Provider Note   CSN: [id number] Arrival date & time: 07/04/17  0036     History   Chief Complaint Chief Complaint  Patient presents with  . Pain    HPI Joy Yoder is a 51 y.o. female.  Patient with history of degenerative disc disease, diabetes, chronic kidney disease, previous stroke presenting with diffuse body pain.  Patient states she has had a pain pump for morphine for the past 5 years.  She believes it is out and she is going through withdrawals.  She complains of shaking all over pain everywhere in her body.  Complains of pain and spasms in her bilateral legs.  She was seen at [facility] but left due to disagreements with the staff.  She does not know when the last time her morphine pump was refilled.  She states has been beeping for about the past month and she did not realize it was out.  She denies any change in her chronic pain pattern.  She denies any focal weakness, numbness or tingling.  No bowel or bladder incontinence.  No fever. She states she has had multiple episodes of vomiting and diarrhea and Xray and [place] showed possible bowel obstruction.   The history is provided by the patient and a relative.    Past Medical History:  Diagnosis Date  . Anxiety   . CHF (congestive heart failure) ([place])   . DDD (degenerative disc disease)   . DDD (degenerative disc disease), thoracic    chest to back, legs,   . Diabetes mellitus   . DJD (degenerative joint disease)   . Hypertension   . Kidney disease    stage 3   . Neuropathy   . Stroke [facility])     There are no active problems to display for this patient.   Past Surgical History:  Procedure Laterality Date  . ABDOMINAL HYSTERECTOMY    . bladder tack    . CHOLECYSTECTOMY    . EYE SURGERY    . THYROID SURGERY      OB History    No data available       Home Medications    Prior to Admission medications   Medication Sig Start Date End Date Taking? Authorizing  Provider  amitriptyline (ELAVIL) 25 MG tablet Take 50 mg by mouth 3 times/day as needed-between meals & bedtime. Sleep    [provider]  aspirin EC 81 MG tablet Take 81 mg by mouth daily.      [provider]  Aspirin-Acetaminophen-Caffeine (GOODY HEADACHE PO) Take 1 packet by mouth daily as needed. Headache     [provider]  busPIRone (BUSPAR) 10 MG tablet Take 10 mg by mouth 3 (three) times daily.      [provider]  cholecalciferol (VITAMIN D) 1000 UNITS tablet Take 1,000 Units by mouth daily.      [provider]  dexlansoprazole (DEXILANT) 60 MG capsule Take 60 mg by mouth daily.      [provider]  DULoxetine (CYMBALTA) 60 MG capsule Take 60 mg by mouth 2 (two) times daily.      [provider]  ferrous sulfate 325 (65 FE) MG tablet Take 325 mg by mouth daily.      [provider]  furosemide (LASIX) 40 MG tablet Take 1 tablet (40 mg total) by mouth daily. 06/01/11 05/31/12  Blanchie Dessert, MD  gabapentin (NEURONTIN) 300 MG capsule Take 300 mg by mouth 3 (three) times daily.  [provider]  insulin glargine (LANTUS SOLOSTAR) 100 UNIT/ML injection Inject 45 Units into the skin at bedtime.      [provider]  insulin lispro (HUMALOG) 100 UNIT/ML injection Inject 14-30 Units into the skin 3 (three) times daily before meals. Patient injects according to sliding scale at home.    [provider]  lisinopril-hydrochlorothiazide (PRINZIDE,ZESTORETIC) 10-12.5 MG per tablet Take 1 tablet by mouth daily.      [provider]  saxagliptin HCl (ONGLYZA) 5 MG TABS tablet Take 5 mg by mouth daily.      [provider]    Family History No family history on file.  Social History Social History   Tobacco Use  . Smoking status: Never Smoker  Substance Use Topics  . Alcohol use: Yes    Comment: occ  . Drug use: No     Allergies   Metformin and related; Nsaids;  Pravastatin; Reglan [metoclopramide]; and Tramadol   Review of Systems Review of Systems  Constitutional: Positive for fatigue. Negative for fever.  HENT: Negative for congestion and rhinorrhea.   Respiratory: Negative for chest tightness and shortness of breath.   Cardiovascular: Negative for chest pain.  Gastrointestinal: Positive for nausea. Negative for abdominal pain and vomiting.  Genitourinary: Negative for dysuria and hematuria.  Musculoskeletal: Positive for arthralgias, back pain, myalgias and neck pain.  Skin: Negative for rash.  Neurological: Positive for weakness. Negative for dizziness and headaches.    all other systems are negative except as noted in the HPI and PMH.    Physical Exam Updated Vital Signs BP 123/64   Pulse 87   Temp 98.1 F (36.7 C) (Oral)   Resp 18   Ht 5\' 2"  (1.575 m)   Wt 78.5 kg (173 lb)   SpO2 98%   BMI 31.64 kg/m   Physical Exam  Constitutional: She is oriented to person, place, and time. She appears well-developed and well-nourished. No distress.  Chronically ill appearing  HENT:  Head: Normocephalic and atraumatic.  Mouth/Throat: Oropharynx is clear and moist. No oropharyngeal exudate.  Eyes: Conjunctivae and EOM are normal. Pupils are equal, round, and reactive to light.  Neck: Normal range of motion. Neck supple.  No meningismus.  Cardiovascular: Normal rate, regular rhythm, normal heart sounds and intact distal pulses.  No murmur heard. Pulmonary/Chest: Effort normal and breath sounds normal. No respiratory distress.  Abdominal: Soft. There is no tenderness. There is no rebound and no guarding.  Pain pump in R abdomen. No overlying erythema  Musculoskeletal: Normal range of motion. She exhibits tenderness. She exhibits no edema.  Diffuse paraspinal thoracic and lumbar tenderness  5/5 strength in bilateral lower extremities. Ankle plantar and dorsiflexion intact. Great toe extension intact bilaterally. +2 DP and PT pulses.  Unable to elicit patellar reflexes bilaterally. Antalgic gait   Neurological: She is alert and oriented to person, place, and time. No cranial nerve deficit. She exhibits normal muscle tone. Coordination normal.  No ataxia on finger to nose bilaterally. No pronator drift. 5/5 strength throughout. CN 2-12 intact.Equal grip strength. Sensation intact.   Skin: Skin is warm.  Psychiatric: She has a normal mood and affect. Her behavior is normal.  Nursing note and vitals reviewed.    ED Treatments / Results  Labs (all labs ordered are listed, but only abnormal results are displayed) Labs Reviewed  CBC WITH DIFFERENTIAL/PLATELET - Abnormal; Notable for the following components:      Result Value   Hemoglobin 10.9 (*)  MCH 25.1 (*)    RDW 15.6 (*)    All other components within normal limits  BASIC METABOLIC PANEL - Abnormal; Notable for the following components:   Chloride 112 (*)    Glucose, Bld 112 (*)    BUN 40 (*)    Creatinine, Ser 2.40 (*)    GFR calc non Af Amer 22 (*)    GFR calc Af Amer 25 (*)    All other components within normal limits  TROPONIN I  BRAIN NATRIURETIC PEPTIDE  URINALYSIS, ROUTINE W REFLEX MICROSCOPIC    EKG  EKG Interpretation  Date/Time:  Saturday July 04 2017 01:12:58 EST Ventricular Rate:  87 PR Interval:    QRS Duration: 89 QT Interval:  346 QTC Calculation: 417 R Axis:   74 Text Interpretation:  Sinus rhythm No significant change was found Confirmed by Ezequiel Essex [phone]) on 07/04/2017 1:26:31 AM       Radiology Dg Chest 2 View  Result Date: 07/04/2017 CLINICAL DATA:  Shortness of breath. EXAM: CHEST  2 VIEW COMPARISON:  Radiographs 06/07/2011 FINDINGS: Low lung volumes. The cardiomediastinal contours are normal. The lungs are clear. Pulmonary vasculature is normal. No consolidation, pleural effusion, or pneumothorax. No acute osseous abnormalities are seen. IMPRESSION: Hypoventilatory chest without acute abnormality.  Electronically Signed   By: Jeb Levering M.D.   On: 07/04/2017 04:05   Dg Abd 2 Views  Result Date: 07/04/2017 CLINICAL DATA:  Abdominal pain. EXAM: ABDOMEN - 2 VIEW COMPARISON:  None. FINDINGS: No bowel dilatation to suggest obstruction. No evidence of free air. Small to moderate colonic stool burden. No radiopaque calculi. Cholecystectomy clips in the right upper quadrant. Stimulator with battery pack projecting over the right lower abdomen. No acute osseous abnormalities. IMPRESSION: Normal bowel gas pattern. No free air. Small to moderate colonic stool burden. Electronically Signed   By: Jeb Levering M.D.   On: 07/04/2017 05:32    Procedures Procedures (including critical care time)  Medications Ordered in ED Medications  morphine 4 MG/ML injection 6 mg (6 mg Intravenous Not Given 07/04/17 0321)  ondansetron (ZOFRAN) injection 4 mg (not administered)  Morphine Sulfate (PF) 10 MG/ML SOLN (6 mg  Given 07/04/17 0321)     Initial Impression / Assessment and Plan / ED Course  I have reviewed the triage vital signs and the nursing notes.  Pertinent labs & imaging results that were available during my care of the patient were reviewed by me and considered in my medical decision making (see chart for details).    Patient here with chronic pain and opiate withdrawals after her morphine pump has run out.  She denies any pain in her chronic pain pattern.  No focal weakness, numbness or tingling.  No bowel or bladder incontinence.  IV and dose of morphine given. Xray obtained given patient's report of bowel obstruction at outside hospital.  Creatinine noted.  Records obtained from [facility].  On November 28 her creatinine was 2.46.  Patient states she was not told this. She believes her normal creatinine is 1.4.  Xray negative for SBO. UA negative.   Suspect majority of symptoms due to opiate withdrawal. Patient appears to have new AKI if she is correct about her previous  renal function.  Observation for hydration d/w Dr. Myna Hidalgo. Final Clinical Impressions(s) / ED Diagnoses   Final diagnoses:  Abdominal pain  Acute renal failure, unspecified acute renal failure type (HCC)  Chronic pain syndrome    ED Discharge Orders    None  Ezequiel Essex, MD 07/04/17 [phone]

## 2017-07-04 NOTE — ED Triage Notes (Addendum)
Pt has hx of DDD, arthritis and is on a pain pump of morphine that started beeping 3 weeks ago, c/o shaking, pain to left shoulder,chest pain, spasms to bilateral legs, n/v earlier in the day,. Pt states that she came to [facility] from [place] er because she did not like "the way they talked to me"

## 2017-07-04 NOTE — ED Notes (Signed)
Xray tech at bedside. Pt wants more nausea meds prior to pain meds. She is still on bedpan trying to urinate.

## 2017-07-04 NOTE — ED Notes (Signed)
Confirmed with EDP he does want further labs

## 2017-07-04 NOTE — ED Notes (Signed)
Records requested from [facility] from [place] visit

## 2017-07-04 NOTE — ED Notes (Signed)
Report attempted to floor.   

## 2017-07-04 NOTE — H&P (Signed)
History and Physical    Joy Yoder EXN:[id number] DOB: 1966-02-01 DOA: 07/04/2017  PCP: Patient, No Pcp Per  Patient coming from: Home  Chief Complaint: Generally not feeling well, pain pump ran out  HPI: Joy Yoder is a 51 y.o. female with medical history significant of congestive heart failure, did chronic pain syndrome, kidney disease stage III, hypertension who [facility] July 04, 2017 with complaints that her pain pump had run out of morphine roughly 3 weeks prior.  Patient states that she believes that the pump has been beeping for the past 2 weeks.  She states she was supposed to have an appointment but was on aware of this appointment and therefore missed it with her pain specialist in [facility].  She states that at this morning she was feeling achy all over, having significant nausea and vomiting, as well as chest pain and got nervous and called EMS to bring her to the hospital.  She reports that she has the pain pump which was placed years ago for chronic pain syndrome.  She states she only follows up with her pain clinic once a year.  Interestingly patient denies having any withdrawal symptoms when she first heard the pump beeping weeks ago.  Patient reports feeling sweaty and having chills for the past few days.  Denies any sick contacts.  Denies any dysuria, hematochezia, hematemesis, melena.  Patient also reports of pain in both of her legs.  She denies any focal weaknesses.  Of note patient was previously seen and seen at [facility] a few days prior to coming to the emergency department for similar symptoms and concerns and states that they treated her poorly there which is why she ultimately left and did not return there for evaluation today.  ED Course: Patient was seen and evaluated by emergency room physician.  Per EDP note patient was given an IV dose of morphine and underwent x-ray to evaluate for  possible bowel obstruction that patient reported from an outside hospital.  Her creatinine was noted to be elevated even though prior records from [facility] on November 28 show her creatinine at 2.46.  X-ray was negative for SBO.  UA was negative.  TRH asked to place patient in observation for hydration given concern for AK I.  Review of Systems: As per HPI otherwise 10 point review of systems negative.    Past Medical History:  Diagnosis Date  . Anxiety   . CHF (congestive heart failure) ([place])   . DDD (degenerative disc disease)   . DDD (degenerative disc disease), thoracic    chest to back, legs,   . Diabetes mellitus   . DJD (degenerative joint disease)   . Hypertension   . Kidney disease    stage 3   . Neuropathy   . Stroke [facility])     Past Surgical History:  Procedure Laterality Date  . ABDOMINAL HYSTERECTOMY    . bladder tack    . CHOLECYSTECTOMY    . EYE SURGERY    . THYROID SURGERY       reports that  has never smoked. She does not have any smokeless tobacco history on file. She reports that she drinks alcohol. She reports that she does not use drugs.  Allergies  Allergen Reactions  . Metformin And Related     Diarrhea   . Nsaids     Kidney disease  . Pravastatin     Swelling   .  Reglan [Metoclopramide]     shakes  . Tramadol     itching    No family history on file.  Prior to Admission medications   Medication Sig Start Date End Date Taking? Authorizing Provider  amitriptyline (ELAVIL) 25 MG tablet Take 50 mg by mouth 3 times/day as needed-between meals & bedtime. Sleep    [provider]  aspirin EC 81 MG tablet Take 81 mg by mouth daily.      [provider]  Aspirin-Acetaminophen-Caffeine (GOODY HEADACHE PO) Take 1 packet by mouth daily as needed. Headache     [provider]  busPIRone (BUSPAR) 10 MG tablet Take 10 mg by mouth 3 (three) times daily.      [provider]  cholecalciferol (VITAMIN D) 1000  UNITS tablet Take 1,000 Units by mouth daily.      [provider]  dexlansoprazole (DEXILANT) 60 MG capsule Take 60 mg by mouth daily.      [provider]  DULoxetine (CYMBALTA) 60 MG capsule Take 60 mg by mouth 2 (two) times daily.      [provider]  ferrous sulfate 325 (65 FE) MG tablet Take 325 mg by mouth daily.      [provider]  furosemide (LASIX) 40 MG tablet Take 1 tablet (40 mg total) by mouth daily. 06/01/11 05/31/12  Blanchie Dessert, MD  gabapentin (NEURONTIN) 300 MG capsule Take 300 mg by mouth 3 (three) times daily.      [provider]  insulin glargine (LANTUS SOLOSTAR) 100 UNIT/ML injection Inject 45 Units into the skin at bedtime.      [provider]  insulin lispro (HUMALOG) 100 UNIT/ML injection Inject 14-30 Units into the skin 3 (three) times daily before meals. Patient injects according to sliding scale at home.    [provider]  lisinopril-hydrochlorothiazide (PRINZIDE,ZESTORETIC) 10-12.5 MG per tablet Take 1 tablet by mouth daily.      [provider]  saxagliptin HCl (ONGLYZA) 5 MG TABS tablet Take 5 mg by mouth daily.      [provider]    Physical Exam: Vitals:   07/04/17 0600 07/04/17 0630 07/04/17 0700 07/04/17 0814  BP: (!) 154/64 (!) 157/61 (!) 154/63 (!) 152/60  Pulse: 88 90 91 89  Resp:    18  Temp:    98.7 F (37.1 C)  TempSrc:      SpO2: 100% 99% 100% 98%  Weight:    78.5 kg (172 lb 16 oz)  Height:    5\' 2"  (1.575 m)      Constitutional: Moderate distress writhing in bed Vitals:   07/04/17 0600 07/04/17 0630 07/04/17 0700 07/04/17 0814  BP: (!) 154/64 (!) 157/61 (!) 154/63 (!) 152/60  Pulse: 88 90 91 89  Resp:    18  Temp:    98.7 F (37.1 C)  TempSrc:      SpO2: 100% 99% 100% 98%  Weight:    78.5 kg (172 lb 16 oz)  Height:    5\' 2"  (1.575 m)   Eyes: PERRL, lids and conjunctivae normal ENMT: Mucous membranes are moist. Posterior pharynx clear of  any exudate or lesions.Normal dentition.  Neck: normal, supple, no masses, no thyromegaly Respiratory: clear to auscultation bilaterally, no wheezing, no crackles. Normal respiratory effort. No accessory muscle use.  Cardiovascular: Regular rate and rhythm, no murmurs / rubs / gallops. No extremity edema. 2+ pedal pulses. No carotid bruits.  Abdomen: no tenderness, no masses palpated. No hepatosplenomegaly.  Bowel sounds positive.  Musculoskeletal: no clubbing / cyanosis. No joint deformity upper and lower extremities. Good ROM, no contractures. Normal muscle tone.  Skin: no rashes, lesions, ulcers. No induration Neurologic: CN 2-12 grossly intact. Sensation intact, DTR normal. Strength 5/5 in all 4.  Psychiatric: Normal judgment and insight. Alert and oriented x 3. Normal mood.    Labs on Admission: I have personally reviewed following labs and imaging studies  CBC: Recent Labs  Lab 07/04/17 0214  WBC 7.9  NEUTROABS 3.5  HGB 10.9*  HCT 36.0  MCV 82.8  PLT 732   Basic Metabolic Panel: Recent Labs  Lab 07/04/17 0214  NA 141  K 4.7  CL 112*  CO2 23  GLUCOSE 112*  BUN 40*  CREATININE 2.40*  CALCIUM 9.7   GFR: Estimated Creatinine Clearance: 26.3 mL/min (A) (by C-G formula based on SCr of 2.4 mg/dL (H)). Liver Function Tests: No results for input(s): AST, ALT, ALKPHOS, BILITOT, PROT, ALBUMIN in the last 168 hours. No results for input(s): LIPASE, AMYLASE in the last 168 hours. No results for input(s): AMMONIA in the last 168 hours. Coagulation Profile: No results for input(s): INR, PROTIME in the last 168 hours. Cardiac Enzymes: Recent Labs  Lab 07/04/17 0214  TROPONINI <0.03   BNP (last 3 results) No results for input(s): PROBNP in the last 8760 hours. HbA1C: No results for input(s): HGBA1C in the last 72 hours. CBG: No results for input(s): GLUCAP in the last 168 hours. Lipid Profile: No results for input(s): CHOL, HDL, LDLCALC, TRIG, CHOLHDL, LDLDIRECT in the  last 72 hours. Thyroid Function Tests: No results for input(s): TSH, T4TOTAL, FREET4, T3FREE, THYROIDAB in the last 72 hours. Anemia Panel: No results for input(s): VITAMINB12, FOLATE, FERRITIN, TIBC, IRON, RETICCTPCT in the last 72 hours. Urine analysis:    Component Value Date/Time   COLORURINE YELLOW 07/04/2017 0528   APPEARANCEUR CLEAR 07/04/2017 0528   LABSPEC 1.015 07/04/2017 0528   PHURINE 7.0 07/04/2017 0528   GLUCOSEU NEGATIVE 07/04/2017 0528   HGBUR NEGATIVE 07/04/2017 0528   BILIRUBINUR NEGATIVE 07/04/2017 0528   KETONESUR NEGATIVE 07/04/2017 0528   PROTEINUR 100 (A) 07/04/2017 0528   UROBILINOGEN 0.2 06/07/2011 1420   NITRITE NEGATIVE 07/04/2017 0528   LEUKOCYTESUR NEGATIVE 07/04/2017 0528   Sepsis Labs: !!!!!!!!!!!!!!!!!!!!!!!!!!!!!!!!!!!!!!!!!!!! @LABRCNTIP (procalcitonin:4,lacticidven:4) )No results found for this or any previous visit (from the past 240 hour(s)).   Radiological Exams on Admission: Dg Chest 2 View  Result Date: 07/04/2017 CLINICAL DATA:  Shortness of breath. EXAM: CHEST  2 VIEW COMPARISON:  Radiographs 06/07/2011 FINDINGS: Low lung volumes. The cardiomediastinal contours are normal. The lungs are clear. Pulmonary vasculature is normal. No consolidation, pleural effusion, or pneumothorax. No acute osseous abnormalities are seen. IMPRESSION: Hypoventilatory chest without acute abnormality. Electronically Signed   By: Jeb Levering M.D.   On: 07/04/2017 04:05   Dg Abd 2 Views  Result Date: 07/04/2017 CLINICAL DATA:  Abdominal pain. EXAM: ABDOMEN - 2 VIEW COMPARISON:  None. FINDINGS: No bowel dilatation to suggest obstruction. No evidence of free air. Small to moderate colonic stool burden. No radiopaque calculi. Cholecystectomy clips in the right upper quadrant. Stimulator with battery pack projecting over the right lower abdomen. No acute osseous abnormalities. IMPRESSION: Normal bowel gas pattern. No free air. Small to moderate colonic stool burden.  Electronically Signed   By: Jeb Levering M.D.   On: 07/04/2017 05:32    EKG: Independently reviewed.  Sinus rhythm with slightly poor R wave progression  Assessment/Plan Principal Problem:  Intractable pain Active Problems:   Acute renal failure ([place])   Hypertension   Type 2 diabetes mellitus without complication (HCC)     Intractable pain likely due to withdrawal from morphine -Patient states that she is on a morphine pump and has been on such for 5 years -Reports that she has an appointment on Monday, December 3 for a refill of his pump -Per the phone notes than epic patient had called the clinic to make an appointment -We will defer further pain management patient's pain specialist outpatient -Continue patient's home pain regimen which includes Neurontin  Acute renal failure -Unclear what patient's baseline creatinine is as she was seen in [place] 3 days prior to this admission and her creatinine was actually higher at that point than it was today -Serial BMPs -IV fluids -Avoid nephrotoxins -Ultrasound of the kidneys  Hypertension, essential -Continue patient's home medications of amlodipine, Coreg, hydralazineAnd losartan -Continue statin  Diabetes mellitus type 2, uncomplicated - Continue patient's home dose of Toujeo but at a lesser amount which will be 35 units daily given patient's nausea and vomiting     DVT prophylaxis: Lovenox  Code Status: Full code  Family Communication: No family bedside Disposition Plan: Discharge home likely in the morning Consults called: None  Admission status: Observation u med surge   Loretha Stapler MD [facility] Pager [phone]  If 7PM-7AM, please contact night-coverage [email or web address] Password [email or web address]  07/04/2017, 12:54 PM

## 2017-07-04 NOTE — ED Notes (Signed)
ED Provider at bedside. 

## 2017-07-04 NOTE — ED Notes (Signed)
Pt reports she uses 2 liters oxygen at night and would like that at this time, 2 liters via [place] place. Pt continues to sit on bed pan in attempt to provide urine sample. EDP made aware pt reports she is still having pain at this time

## 2017-07-05 ENCOUNTER — Observation Stay (HOSPITAL_COMMUNITY): Payer: Medicare Other

## 2017-07-05 DIAGNOSIS — R52 Pain, unspecified: Secondary | ICD-10-CM | POA: Diagnosis not present

## 2017-07-05 DIAGNOSIS — R1084 Generalized abdominal pain: Secondary | ICD-10-CM | POA: Diagnosis not present

## 2017-07-05 DIAGNOSIS — Z794 Long term (current) use of insulin: Secondary | ICD-10-CM | POA: Diagnosis not present

## 2017-07-05 DIAGNOSIS — E119 Type 2 diabetes mellitus without complications: Secondary | ICD-10-CM | POA: Diagnosis not present

## 2017-07-05 DIAGNOSIS — I10 Essential (primary) hypertension: Secondary | ICD-10-CM | POA: Diagnosis not present

## 2017-07-05 DIAGNOSIS — N170 Acute kidney failure with tubular necrosis: Secondary | ICD-10-CM

## 2017-07-05 DIAGNOSIS — G894 Chronic pain syndrome: Secondary | ICD-10-CM | POA: Diagnosis not present

## 2017-07-05 LAB — UREA NITROGEN, URINE: UREA NITROGEN UR: 583 mg/dL

## 2017-07-05 LAB — GLUCOSE, CAPILLARY
GLUCOSE-CAPILLARY: 251 mg/dL — AB (ref 65–99)
GLUCOSE-CAPILLARY: 243 mg/dL — AB (ref 65–99)
Glucose-Capillary: 201 mg/dL — ABNORMAL HIGH (ref 65–99)

## 2017-07-05 LAB — BASIC METABOLIC PANEL
ANION GAP: 10 (ref 5–15)
BUN: 33 mg/dL — ABNORMAL HIGH (ref 6–20)
CHLORIDE: 110 mmol/L (ref 101–111)
CO2: 20 mmol/L — ABNORMAL LOW (ref 22–32)
Calcium: 9.4 mg/dL (ref 8.9–10.3)
Creatinine, Ser: 1.7 mg/dL — ABNORMAL HIGH (ref 0.44–1.00)
GFR, EST AFRICAN AMERICAN: 39 mL/min — AB (ref >60)
GFR, EST NON AFRICAN AMERICAN: 33 mL/min — AB (ref >60)
Glucose, Bld: 253 mg/dL — ABNORMAL HIGH (ref 65–99)
POTASSIUM: 4.1 mmol/L (ref 3.5–5.1)
SODIUM: 140 mmol/L (ref 135–145)

## 2017-07-05 LAB — HIV ANTIBODY (ROUTINE TESTING W REFLEX): HIV SCREEN 4TH GENERATION: NONREACTIVE

## 2017-07-05 MED ORDER — DICYCLOMINE HCL 10 MG/ML IM SOLN
10.0000 mg | Freq: Once | INTRAMUSCULAR | Status: AC
  Administered 2017-07-05: 10 mg via INTRAMUSCULAR
  Filled 2017-07-05: qty 1

## 2017-07-05 MED ORDER — SODIUM CHLORIDE 0.9 % IV SOLN
INTRAVENOUS | Status: DC
  Administered 2017-07-05 – 2017-07-07 (×5): via INTRAVENOUS

## 2017-07-05 MED ORDER — LORAZEPAM 2 MG/ML IJ SOLN
1.0000 mg | Freq: Once | INTRAMUSCULAR | Status: AC
  Administered 2017-07-05: 1 mg via INTRAVENOUS
  Filled 2017-07-05: qty 1

## 2017-07-05 MED ORDER — ENOXAPARIN SODIUM 40 MG/0.4ML ~~LOC~~ SOLN
40.0000 mg | SUBCUTANEOUS | Status: DC
  Filled 2017-07-05 (×2): qty 0.4

## 2017-07-05 MED ORDER — ONDANSETRON HCL 4 MG/2ML IJ SOLN
4.0000 mg | Freq: Four times a day (QID) | INTRAMUSCULAR | Status: DC
  Administered 2017-07-05 – 2017-07-07 (×9): 4 mg via INTRAVENOUS
  Filled 2017-07-05 (×9): qty 2

## 2017-07-05 NOTE — Progress Notes (Signed)
Pt continues to experience nausea and vomiting (small amount of bile) following zofran and phenergan administration. Provided patient with ginger ale, peppermint essential oil, fan for her room, and cold washcloths for her head. MD made aware, once benadryl ordered and provided. Will continue to monitor.

## 2017-07-05 NOTE — Progress Notes (Addendum)
PROGRESS NOTE    Joy Yoder  VPX:[id number] DOB: Mar 23, 1966 DOA: 07/04/2017 PCP: Patient, No Pcp Per   Brief Narrative:  Joy Yoder is a 51 y.o. female with medical history significant of congestive heart failure, did chronic pain syndrome, kidney disease stage III, hypertension who [facility] with complaints that her pain pump had run out of morphine roughly 3 weeks prior.  Patient states that she believes that the pump has been beeping for the past 2 weeks.  She states she was supposed to have an appointment but was on aware of this appointment and therefore missed it with her pain specialist in [facility].  She states that at this morning she was feeling achy all over, having significant nausea and vomiting, as well as chest pain and got nervous and called EMS to bring her to the hospital.  She reports that she has the pain pump which was placed years ago for chronic pain syndrome.  She states she only follows up with her pain clinic once a year.  Interestingly patient denies having any withdrawal symptoms when she first heard the pump beeping weeks ago.  Patient reports feeling sweaty and having chills for the past few days.  Denies any sick contacts.  Denies any dysuria, hematochezia, hematemesis, melena.  Patient also reports of pain in both of her legs.  She denies any focal weaknesses.  Of note patient was previously seen and seen at [facility] a few days prior to coming to the emergency department for similar symptoms and concerns and states that they treated her poorly there which is why she ultimately left and did not return there for evaluation today.  ED Course: Patient was seen and evaluated by emergency room physician.  Per EDP note patient was given an IV dose of morphine and underwent x-ray to evaluate for possible bowel obstruction that patient reported from an outside hospital.  Her creatinine  was noted to be elevated even though prior records from [facility] on November 28 show her creatinine at 2.46.  X-ray was negative for SBO.  UA was negative.  TRH asked to place patient in observation for hydration given concern for AK I.   Assessment & Plan:   Principal Problem:   Intractable pain Active Problems:   Acute renal failure (HCC)   Hypertension   Type 2 diabetes mellitus without complication (HCC)  Intractable nausea and vomiting -Continue scheduled Zofran -Continue as needed Phenergan -Continue Bentyl -1 dose of Ativan -Continue IV fluids  Intractable pain likely due to withdrawal from morphine - on a morphine pump and has been on such for 5 years -Reports that she has an appointment on Monday, December 3 for a refill of his pump -Per the phone notes than epic patient had called the clinic to make an appointment -Patient reports morphine doses here in the hospital have been making her ill and request something different - Dilaudid 1 mg every 2 hours as needed for pain -Continue Cymbalta, Neurontin  Acute renal failure likely due to hypovolemia from nausea and vomiting/ATN -In this morning of 1.7 -Avoid nephrotoxins -Ultrasound of the kidneys showing no abnormalities -Continue IV fluids  Hypertension, essential -Continue patient's home medications of amlodipine, Coreg, hydralazine And losartan -Continue statin  Diabetes mellitus type 2, uncomplicated -Lantus 45 units daily -SSI - CBG before meals at bedtime      DVT prophylaxis: Lovenox Code Status: Full code Family Communication: No family  is bedside Disposition Plan: Pending improvement in nausea and vomiting   Consultants:   None  Procedures:   None  Antimicrobials:   None   Subjective: Patient seen this morning.  She is having significant nausea and vomiting as well as abdominal pain.  She states that her abdominal pain is different today than it normally is.  She has her  home medications which have been brought in by her daughter.  She states she normally takes something for nerves.    Objective: Vitals:   07/04/17 1408 07/04/17 1733 07/04/17 2124 07/05/17 0445  BP: (!) 209/75 (!) 188/72 (!) 183/80 (!) 168/74  Pulse:   97 95  Resp:   20 20  Temp:   98.6 F (37 C) 98.4 F (36.9 C)  TempSrc:   Oral Oral  SpO2:   100% 100%  Weight:      Height:        Intake/Output Summary (Last 24 hours) at 07/05/2017 1316 Last data filed at 07/05/2017 0900 Gross per 24 hour  Intake 60 ml  Output -  Net 60 ml   Filed Weights   07/04/17 0110 07/04/17 0814  Weight: 78.5 kg (173 lb) 78.5 kg (172 lb 16 oz)    Examination:  General exam: Moderate distress vomiting during exam Respiratory system: Clear to auscultation. Respiratory effort normal. Cardiovascular system: S1 & S2 heard, RRR. No JVD, murmurs, rubs, gallops or clicks. No pedal edema. Gastrointestinal system: Abdomen is nondistended, soft and tender around the umbilicus and in the suprapubic area. No organomegaly or masses felt. Normal bowel sounds heard. Central nervous system: Alert and oriented. No focal neurological deficits. Extremities: Symmetric 5 x 5 power. Skin: No rashes, lesions or ulcers Psychiatry: Judgement and insight appear normal. Mood & affect appropriate.     Data Reviewed: I have personally reviewed following labs and imaging studies  CBC: Recent Labs  Lab 07/04/17 0214  WBC 7.9  NEUTROABS 3.5  HGB 10.9*  HCT 36.0  MCV 82.8  PLT 403   Basic Metabolic Panel: Recent Labs  Lab 07/04/17 0214 07/05/17 0437  NA 141 140  K 4.7 4.1  CL 112* 110  CO2 23 20*  GLUCOSE 112* 253*  BUN 40* 33*  CREATININE 2.40* 1.70*  CALCIUM 9.7 9.4   GFR: Estimated Creatinine Clearance: 37.2 mL/min (A) (by C-G formula based on SCr of 1.7 mg/dL (H)). Liver Function Tests: No results for input(s): AST, ALT, ALKPHOS, BILITOT, PROT, ALBUMIN in the last 168 hours. No results for input(s):  LIPASE, AMYLASE in the last 168 hours. No results for input(s): AMMONIA in the last 168 hours. Coagulation Profile: No results for input(s): INR, PROTIME in the last 168 hours. Cardiac Enzymes: Recent Labs  Lab 07/04/17 0214  TROPONINI <0.03   BNP (last 3 results) No results for input(s): PROBNP in the last 8760 hours. HbA1C: No results for input(s): HGBA1C in the last 72 hours. CBG: Recent Labs  Lab 07/04/17 2123 07/05/17 0748 07/05/17 1154  GLUCAP 239* 251* 243*   Lipid Profile: No results for input(s): CHOL, HDL, LDLCALC, TRIG, CHOLHDL, LDLDIRECT in the last 72 hours. Thyroid Function Tests: No results for input(s): TSH, T4TOTAL, FREET4, T3FREE, THYROIDAB in the last 72 hours. Anemia Panel: No results for input(s): VITAMINB12, FOLATE, FERRITIN, TIBC, IRON, RETICCTPCT in the last 72 hours. Sepsis Labs: No results for input(s): PROCALCITON, LATICACIDVEN in the last 168 hours.  No results found for this or any previous visit (from the past 240 hour(s)).  Radiology Studies: Ct Abdomen Pelvis Wo Contrast  Result Date: 07/04/2017 CLINICAL DATA:  Lower abdominal pain. EXAM: CT ABDOMEN AND PELVIS WITHOUT CONTRAST TECHNIQUE: Multidetector CT imaging of the abdomen and pelvis was performed following the standard protocol without IV contrast. COMPARISON:  KUB from earlier today FINDINGS: Lower chest: No acute abnormality. Hepatobiliary: The patient is status post cholecystectomy. No liver masses are identified. Mild prominence of the common bile duct is likely due to previous cholecystectomy. Pancreas: Unremarkable. No pancreatic ductal dilatation or surrounding inflammatory changes. Spleen: Normal in size without focal abnormality. Adrenals/Urinary Tract: Adrenal glands are normal. No renal stones identified. Mild pelvicaliectasis bilaterally, right greater than left without significant perinephric stranding. Mild prominence of the ureters, also right greater than left with no  obvious stones. The ureters are normal in caliber distally. The most distal aspect of the right ureter is not well seen. The bladder is distended but thin walled. Stomach/Bowel: The stomach and small bowel are normal. Mild fecal loading in the colon. The colon is otherwise normal in appearance. The appendix is not seen but there is no secondary evidence of appendicitis. Vascular/Lymphatic: Mild atherosclerotic change seen in the nonaneurysmal aorta. Prominent inguinal node, left greater than right. A representative left inguinal node measures up to 15 mm in short axis. No other adenopathy. Reproductive: Status post hysterectomy. No adnexal masses. Other: A spinal stimulator device terminates at the T12-L1 level. No free air or free fluid. Musculoskeletal: No acute or significant osseous findings. IMPRESSION: 1. There is distention of the bladder which is thin walled. Whether the bladder distention is voluntary or due to bladder outlet obstruction, either function or mechanical, cannot be determined on this study. The distention of the bladder may explain the right greater than left pelvicaliectasis and mild ureterectasis. No obvious ureteral stones identified. 2. The appendix is not seen but there is no secondary evidence of appendicitis. 3. Mild fecal loading in the colon. 4. Mild atherosclerotic change in the nonaneurysmal aorta. 5. Prominent inguinal nodes with the largest on the left measuring 15 mm. These are nonspecific. Recommend clinical correlation and attention on follow-up. 6. No other acute abnormalities. Aortic Atherosclerosis (ICD10-I70.0). Electronically Signed   By: Dorise Bullion III M.D   On: 07/04/2017 16:05   Dg Chest 2 View  Result Date: 07/04/2017 CLINICAL DATA:  Shortness of breath. EXAM: CHEST  2 VIEW COMPARISON:  Radiographs 06/07/2011 FINDINGS: Low lung volumes. The cardiomediastinal contours are normal. The lungs are clear. Pulmonary vasculature is normal. No consolidation, pleural  effusion, or pneumothorax. No acute osseous abnormalities are seen. IMPRESSION: Hypoventilatory chest without acute abnormality. Electronically Signed   By: Jeb Levering M.D.   On: 07/04/2017 04:05   Dg Abd 1 View  Result Date: 07/05/2017 CLINICAL DATA:  Abdominal pain. EXAM: ABDOMEN - 1 VIEW COMPARISON:  07/04/2017 FINDINGS: Nonspecific bowel gas pattern without evidence small bowel obstruction. Stool is seen scattered along the colon. Morphine lump device overlies the right lower abdomen. Visualized bony anatomy unremarkable. IMPRESSION: Stable.  No evidence for bowel obstruction. Electronically Signed   By: Misty Stanley M.D.   On: 07/05/2017 10:32   US Renal  Result Date: 07/05/2017 CLINICAL DATA:  Acute renal failure. EXAM: RENAL / URINARY TRACT ULTRASOUND COMPLETE COMPARISON:  CT of the abdomen without contrast on 07/04/2017 FINDINGS: Right Kidney: Length: 10.1 cm. Echogenicity within normal limits. No mass or hydronephrosis visualized. Left Kidney: Length: 10.9 cm. Echogenicity within normal limits. No mass or hydronephrosis visualized. Mild amount of perinephric fluid adjacent to  the upper pole. Bladder: Bladder is significantly distended. IMPRESSION: No significant renal atrophy or evidence of hydronephrosis. The bladder is significantly distended. Electronically Signed   By: Aletta Edouard M.D.   On: 07/05/2017 10:37   Dg Abd 2 Views  Result Date: 07/04/2017 CLINICAL DATA:  Abdominal pain. EXAM: ABDOMEN - 2 VIEW COMPARISON:  None. FINDINGS: No bowel dilatation to suggest obstruction. No evidence of free air. Small to moderate colonic stool burden. No radiopaque calculi. Cholecystectomy clips in the right upper quadrant. Stimulator with battery pack projecting over the right lower abdomen. No acute osseous abnormalities. IMPRESSION: Normal bowel gas pattern. No free air. Small to moderate colonic stool burden. Electronically Signed   By: Jeb Levering M.D.   On: 07/04/2017 05:32         Scheduled Meds: . busPIRone  10 mg Oral TID  . DULoxetine  60 mg Oral BID  . enoxaparin (LOVENOX) injection  40 mg Subcutaneous Q24H  . gabapentin  300 mg Oral TID  . hydrochlorothiazide  12.5 mg Oral Daily  . insulin aspart  0-15 Units Subcutaneous TID WC  . insulin glargine  45 Units Subcutaneous QHS  .  morphine injection  6 mg Intravenous Once  . ondansetron (ZOFRAN) IV  4 mg Intravenous Q6H   Continuous Infusions: . sodium chloride 100 mL/hr at 07/05/17 1133     LOS: 0 days    Time spent: 35 minutes    Loretha Stapler, MD [facility] Pager [phone]  If 7PM-7AM, please contact night-coverage [email or web address] Password [email or web address] 07/05/2017, 1:16 PM

## 2017-07-06 DIAGNOSIS — R1084 Generalized abdominal pain: Secondary | ICD-10-CM | POA: Diagnosis not present

## 2017-07-06 DIAGNOSIS — Z888 Allergy status to other drugs, medicaments and biological substances status: Secondary | ICD-10-CM | POA: Diagnosis not present

## 2017-07-06 DIAGNOSIS — F1123 Opioid dependence with withdrawal: Secondary | ICD-10-CM | POA: Diagnosis present

## 2017-07-06 DIAGNOSIS — N183 Chronic kidney disease, stage 3 (moderate): Secondary | ICD-10-CM | POA: Diagnosis present

## 2017-07-06 DIAGNOSIS — Z9049 Acquired absence of other specified parts of digestive tract: Secondary | ICD-10-CM | POA: Diagnosis not present

## 2017-07-06 DIAGNOSIS — M1990 Unspecified osteoarthritis, unspecified site: Secondary | ICD-10-CM | POA: Diagnosis present

## 2017-07-06 DIAGNOSIS — Z7982 Long term (current) use of aspirin: Secondary | ICD-10-CM | POA: Diagnosis not present

## 2017-07-06 DIAGNOSIS — R52 Pain, unspecified: Secondary | ICD-10-CM | POA: Diagnosis not present

## 2017-07-06 DIAGNOSIS — M5134 Other intervertebral disc degeneration, thoracic region: Secondary | ICD-10-CM | POA: Diagnosis present

## 2017-07-06 DIAGNOSIS — G894 Chronic pain syndrome: Secondary | ICD-10-CM | POA: Diagnosis present

## 2017-07-06 DIAGNOSIS — E1122 Type 2 diabetes mellitus with diabetic chronic kidney disease: Secondary | ICD-10-CM | POA: Diagnosis present

## 2017-07-06 DIAGNOSIS — R112 Nausea with vomiting, unspecified: Secondary | ICD-10-CM | POA: Diagnosis not present

## 2017-07-06 DIAGNOSIS — I509 Heart failure, unspecified: Secondary | ICD-10-CM | POA: Diagnosis present

## 2017-07-06 DIAGNOSIS — I10 Essential (primary) hypertension: Secondary | ICD-10-CM | POA: Diagnosis not present

## 2017-07-06 DIAGNOSIS — E860 Dehydration: Secondary | ICD-10-CM | POA: Diagnosis present

## 2017-07-06 DIAGNOSIS — E861 Hypovolemia: Secondary | ICD-10-CM | POA: Diagnosis present

## 2017-07-06 DIAGNOSIS — I130 Hypertensive heart and chronic kidney disease with heart failure and stage 1 through stage 4 chronic kidney disease, or unspecified chronic kidney disease: Secondary | ICD-10-CM | POA: Diagnosis present

## 2017-07-06 DIAGNOSIS — Z90710 Acquired absence of both cervix and uterus: Secondary | ICD-10-CM | POA: Diagnosis not present

## 2017-07-06 DIAGNOSIS — Z794 Long term (current) use of insulin: Secondary | ICD-10-CM | POA: Diagnosis not present

## 2017-07-06 DIAGNOSIS — N179 Acute kidney failure, unspecified: Secondary | ICD-10-CM | POA: Diagnosis not present

## 2017-07-06 DIAGNOSIS — N170 Acute kidney failure with tubular necrosis: Secondary | ICD-10-CM | POA: Diagnosis present

## 2017-07-06 DIAGNOSIS — Z8673 Personal history of transient ischemic attack (TIA), and cerebral infarction without residual deficits: Secondary | ICD-10-CM | POA: Diagnosis not present

## 2017-07-06 DIAGNOSIS — Z886 Allergy status to analgesic agent status: Secondary | ICD-10-CM | POA: Diagnosis not present

## 2017-07-06 DIAGNOSIS — E119 Type 2 diabetes mellitus without complications: Secondary | ICD-10-CM | POA: Diagnosis not present

## 2017-07-06 LAB — BASIC METABOLIC PANEL
Anion gap: 7 (ref 5–15)
BUN: 34 mg/dL — AB (ref 6–20)
CHLORIDE: 111 mmol/L (ref 101–111)
CO2: 22 mmol/L (ref 22–32)
CREATININE: 1.53 mg/dL — AB (ref 0.44–1.00)
Calcium: 9.4 mg/dL (ref 8.9–10.3)
GFR, EST AFRICAN AMERICAN: 44 mL/min — AB (ref >60)
GFR, EST NON AFRICAN AMERICAN: 38 mL/min — AB (ref >60)
Glucose, Bld: 168 mg/dL — ABNORMAL HIGH (ref 65–99)
POTASSIUM: 4.3 mmol/L (ref 3.5–5.1)
SODIUM: 140 mmol/L (ref 135–145)

## 2017-07-06 LAB — CBC
HCT: 34.6 % — ABNORMAL LOW (ref 36.0–46.0)
Hemoglobin: 10.5 g/dL — ABNORMAL LOW (ref 12.0–15.0)
MCH: 25.2 pg — ABNORMAL LOW (ref 26.0–34.0)
MCHC: 30.3 g/dL (ref 30.0–36.0)
MCV: 83 fL (ref 78.0–100.0)
PLATELETS: 189 10*3/uL (ref 150–400)
RBC: 4.17 MIL/uL (ref 3.87–5.11)
RDW: 15.5 % (ref 11.5–15.5)
WBC: 12.6 10*3/uL — AB (ref 4.0–10.5)

## 2017-07-06 LAB — HEMOGLOBIN A1C
Hgb A1c MFr Bld: 6.9 % — ABNORMAL HIGH (ref 4.8–5.6)
Mean Plasma Glucose: 151 mg/dL

## 2017-07-06 LAB — GLUCOSE, CAPILLARY
GLUCOSE-CAPILLARY: 164 mg/dL — AB (ref 65–99)
GLUCOSE-CAPILLARY: 168 mg/dL — AB (ref 65–99)
GLUCOSE-CAPILLARY: 121 mg/dL — AB (ref 65–99)
GLUCOSE-CAPILLARY: 94 mg/dL (ref 65–99)
Glucose-Capillary: 158 mg/dL — ABNORMAL HIGH (ref 65–99)

## 2017-07-06 MED ORDER — MORPHINE SULFATE (PF) 4 MG/ML IV SOLN
4.0000 mg | INTRAVENOUS | Status: DC | PRN
  Administered 2017-07-06: 4 mg via INTRAVENOUS
  Filled 2017-07-06: qty 1

## 2017-07-06 MED ORDER — DIPHENHYDRAMINE HCL 50 MG/ML IJ SOLN: 25.0000 mg | Freq: Four times a day (QID) | INTRAMUSCULAR | Status: DC | PRN

## 2017-07-06 MED ORDER — LORAZEPAM 2 MG/ML IJ SOLN
0.5000 mg | INTRAMUSCULAR | Status: DC | PRN
  Administered 2017-07-06 – 2017-07-07 (×2): 0.5 mg via INTRAVENOUS
  Filled 2017-07-06 (×2): qty 1

## 2017-07-06 MED ORDER — HYDROMORPHONE HCL 1 MG/ML IJ SOLN
1.0000 mg | INTRAMUSCULAR | Status: DC | PRN
  Administered 2017-07-07: 1 mg via INTRAVENOUS
  Filled 2017-07-06: qty 1

## 2017-07-06 NOTE — Progress Notes (Signed)
PROGRESS NOTE    Joy Yoder  PJA:[id number] DOB: Apr 28, 1966 DOA: 07/04/2017 PCP: Patient, No Pcp Per   Brief Narrative:  Glessie Eustice is a 51 y.o. female with medical history significant of congestive heart failure, did chronic pain syndrome, kidney disease stage III, hypertension who [facility] 2017 with complaints that her pain pump had run out of morphine roughly 3 weeks prior.  Patient states that she believes that the pump has been beeping for the past 2 weeks.  She states she was supposed to have an appointment but was on aware of this appointment and therefore missed it with her pain specialist in [facility].  She states that at this morning she was feeling achy all over, having significant nausea and vomiting, as well as chest pain and got nervous and called EMS to bring her to the hospital.  She reports that she has the pain pump which was placed years ago for chronic pain syndrome.  She states she only follows up with her pain clinic once a year.  Interestingly patient denies having any withdrawal symptoms when she first heard the pump beeping weeks ago.  Patient reports feeling sweaty and having chills for the past few days.  Denies any sick contacts.  Denies any dysuria, hematochezia, hematemesis, melena.  Patient also reports of pain in both of her legs.  She denies any focal weaknesses.  Of note patient was previously seen and seen at [facility] a few days prior to coming to the emergency department for similar symptoms and concerns and states that they treated her poorly there which is why she ultimately left and did not return there for evaluation today.  ED Course: Patient was seen and evaluated by emergency room physician.  Per EDP note patient was given an IV dose of morphine and underwent x-ray to evaluate for possible bowel obstruction that patient reported from an outside hospital.  Her creatinine  was noted to be elevated even though prior records from [facility] on November 28 show her creatinine at 2.46.  X-ray was negative for SBO.  UA was negative.  TRH asked to place patient in observation for hydration given concern for AK I.   Assessment & Plan:   Principal Problem:   Intractable pain Active Problems:   Acute renal failure (HCC)   Hypertension   Type 2 diabetes mellitus without complication (HCC)   Intractable nausea and vomiting  Intractable nausea and vomiting -Continue scheduled Zofran -Continue as needed Phenergan -Continue Bentyl -Continue as needed Ativan -Continue IV fluids -Consider GI consult if vomiting has not improved by 07/07/2017  -order a dose of IV Benadryl -Abdominal x-ray on 07/05/2017 shows no signs of bowel obstruction  Intractable pain likely due to withdrawal from morphine - on a morphine pump and has been on such for 5 years -Reports that she has an appointment on Monday, December 3 for a refill of his pump -Per the phone notes than epic patient had called the clinic to make an appointment -We will transition Dilaudid to every 4 hours as needed -Morphine 4 mg every 3 hours as needed -Continue Cymbalta, Neurontin  Acute renal failure likely due to hypovolemia from nausea and vomiting/ATN -In this morning of 1. 5 3 -Avoid nephrotoxins -Ultrasound of the kidneys showing no abnormalities -Continue IV fluids -Repeat BMP in the morning  Hypertension, essential -Continue patient's home medications of amlodipine, Coreg, hydralazine And losartan -Continue statin  Diabetes mellitus  type 2, uncomplicated -Lantus 45 units daily -SSI - CBG before meals at bedtime      DVT prophylaxis: Lovenox Code Status: Full code Family Communication: Daughter is bedside Disposition Plan: Pending improvement in nausea and vomiting   Consultants:   None  Procedures:   None  Antimicrobials:   None   Subjective: Patient seen this  morning.  She states her abdominal pain is significantly worse.  She says the Ativan she received yesterday improved her pain.  She voices that she is concerned she is missing her appointment with her pain management doctor.  X-ray done yesterday shows no sign of bowel obstruction.    Objective: Vitals:   07/04/17 1733 07/04/17 2124 07/05/17 0445 07/05/17 2130  BP: (!) 188/72 (!) 183/80 (!) 168/74 (!) 198/82  Pulse:  97 95 (!) 107  Resp:  20 20 16   Temp:  98.6 F (37 C) 98.4 F (36.9 C) 99.2 F (37.3 C)  TempSrc:  Oral Oral Oral  SpO2:  100% 100% 100%  Weight:      Height:        Intake/Output Summary (Last 24 hours) at 07/06/2017 1616 Last data filed at 07/06/2017 0544 Gross per 24 hour  Intake -  Output 1100 ml  Net -1100 ml   Filed Weights   07/04/17 0110 07/04/17 0814  Weight: 78.5 kg (173 lb) 78.5 kg (172 lb 16 oz)    Examination:  General exam: Moderate distress vomiting during exam Respiratory system: Clear to auscultation. Respiratory effort normal. Cardiovascular system: S1 & S2 heard, RRR. No JVD, murmurs, rubs, gallops or clicks. No pedal edema. Gastrointestinal system: Abdomen is nondistended, soft and tender around the umbilicus and in the suprapubic area. No organomegaly or masses felt. Normal bowel sounds heard.  Morphine pump palpable in right lower quadrant Central nervous system: Alert and oriented. No focal neurological deficits. Extremities: Symmetric 5 x 5 power. Skin: No rashes, lesions or ulcers Psychiatry: Judgement and insight appear normal. Mood & affect appropriate.     Data Reviewed: I have personally reviewed following labs and imaging studies  CBC: Recent Labs  Lab 07/04/17 0214 07/06/17 1132  WBC 7.9 12.6*  NEUTROABS 3.5  --   HGB 10.9* 10.5*  HCT 36.0 34.6*  MCV 82.8 83.0  PLT 251 734   Basic Metabolic Panel: Recent Labs  Lab 07/04/17 0214 07/05/17 0437 07/06/17 1132  NA 141 140 140  K 4.7 4.1 4.3  CL 112* 110 111  CO2  23 20* 22  GLUCOSE 112* 253* 168*  BUN 40* 33* 34*  CREATININE 2.40* 1.70* 1.53*  CALCIUM 9.7 9.4 9.4   GFR: Estimated Creatinine Clearance: 41.3 mL/min (A) (by C-G formula based on SCr of 1.53 mg/dL (H)). Liver Function Tests: No results for input(s): AST, ALT, ALKPHOS, BILITOT, PROT, ALBUMIN in the last 168 hours. No results for input(s): LIPASE, AMYLASE in the last 168 hours. No results for input(s): AMMONIA in the last 168 hours. Coagulation Profile: No results for input(s): INR, PROTIME in the last 168 hours. Cardiac Enzymes: Recent Labs  Lab 07/04/17 0214  TROPONINI <0.03   BNP (last 3 results) No results for input(s): PROBNP in the last 8760 hours. HbA1C: Recent Labs    07/04/17 1654  HGBA1C 6.9*   CBG: Recent Labs  Lab 07/05/17 1154 07/05/17 1654 07/05/17 2133 07/06/17 0754 07/06/17 1135  GLUCAP 243* 201* 164* 168* 158*   Lipid Profile: No results for input(s): CHOL, HDL, LDLCALC, TRIG, CHOLHDL, LDLDIRECT in the last  72 hours. Thyroid Function Tests: No results for input(s): TSH, T4TOTAL, FREET4, T3FREE, THYROIDAB in the last 72 hours. Anemia Panel: No results for input(s): VITAMINB12, FOLATE, FERRITIN, TIBC, IRON, RETICCTPCT in the last 72 hours. Sepsis Labs: No results for input(s): PROCALCITON, LATICACIDVEN in the last 168 hours.  No results found for this or any previous visit (from the past 240 hour(s)).       Radiology Studies: Dg Abd 1 View  Result Date: 07/05/2017 CLINICAL DATA:  Abdominal pain. EXAM: ABDOMEN - 1 VIEW COMPARISON:  07/04/2017 FINDINGS: Nonspecific bowel gas pattern without evidence small bowel obstruction. Stool is seen scattered along the colon. Morphine lump device overlies the right lower abdomen. Visualized bony anatomy unremarkable. IMPRESSION: Stable.  No evidence for bowel obstruction. Electronically Signed   By: Misty Stanley M.D.   On: 07/05/2017 10:32   US Renal  Result Date: 07/05/2017 CLINICAL DATA:  Acute renal  failure. EXAM: RENAL / URINARY TRACT ULTRASOUND COMPLETE COMPARISON:  CT of the abdomen without contrast on 07/04/2017 FINDINGS: Right Kidney: Length: 10.1 cm. Echogenicity within normal limits. No mass or hydronephrosis visualized. Left Kidney: Length: 10.9 cm. Echogenicity within normal limits. No mass or hydronephrosis visualized. Mild amount of perinephric fluid adjacent to the upper pole. Bladder: Bladder is significantly distended. IMPRESSION: No significant renal atrophy or evidence of hydronephrosis. The bladder is significantly distended. Electronically Signed   By: Aletta Edouard M.D.   On: 07/05/2017 10:37        Scheduled Meds: . busPIRone  10 mg Oral TID  . DULoxetine  60 mg Oral BID  . enoxaparin (LOVENOX) injection  40 mg Subcutaneous Q24H  . gabapentin  300 mg Oral TID  . hydrochlorothiazide  12.5 mg Oral Daily  . insulin aspart  0-15 Units Subcutaneous TID WC  . insulin glargine  45 Units Subcutaneous QHS  .  morphine injection  6 mg Intravenous Once  . ondansetron (ZOFRAN) IV  4 mg Intravenous Q6H   Continuous Infusions: . sodium chloride 100 mL/hr at 07/06/17 0544     LOS: 0 days    Time spent: 30 minutes    Loretha Stapler, MD [facility] Pager [phone]  If 7PM-7AM, please contact night-coverage [email or web address] Password [email or web address] 07/06/2017, 4:16 PM

## 2017-07-07 DIAGNOSIS — N179 Acute kidney failure, unspecified: Secondary | ICD-10-CM | POA: Diagnosis present

## 2017-07-07 DIAGNOSIS — I10 Essential (primary) hypertension: Secondary | ICD-10-CM

## 2017-07-07 DIAGNOSIS — G894 Chronic pain syndrome: Secondary | ICD-10-CM

## 2017-07-07 DIAGNOSIS — R112 Nausea with vomiting, unspecified: Secondary | ICD-10-CM

## 2017-07-07 DIAGNOSIS — Z794 Long term (current) use of insulin: Secondary | ICD-10-CM

## 2017-07-07 DIAGNOSIS — E119 Type 2 diabetes mellitus without complications: Secondary | ICD-10-CM

## 2017-07-07 LAB — BASIC METABOLIC PANEL
Anion gap: 5 (ref 5–15)
BUN: 30 mg/dL — AB (ref 6–20)
CO2: 22 mmol/L (ref 22–32)
CREATININE: 1.38 mg/dL — AB (ref 0.44–1.00)
Calcium: 8.9 mg/dL (ref 8.9–10.3)
Chloride: 114 mmol/L — ABNORMAL HIGH (ref 101–111)
GFR calc Af Amer: 50 mL/min — ABNORMAL LOW (ref >60)
GFR, EST NON AFRICAN AMERICAN: 43 mL/min — AB (ref >60)
GLUCOSE: 88 mg/dL (ref 65–99)
POTASSIUM: 3.5 mmol/L (ref 3.5–5.1)
Sodium: 141 mmol/L (ref 135–145)

## 2017-07-07 LAB — GLUCOSE, CAPILLARY
GLUCOSE-CAPILLARY: 88 mg/dL (ref 65–99)
Glucose-Capillary: 91 mg/dL (ref 65–99)

## 2017-07-07 NOTE — Care Management Note (Signed)
Case Management Note  Patient Details  Name: Joy Yoder MRN: [id number] Date of Birth: 1965-10-13  Subjective/Objective:     Admitted with intractable pain. Chart reviewed for CM needs.  Pt from home, lives alone, has family support. Has insurance with drug coverage and transportation. Pt listed as having no PCP but is followed medically per H&P.                 Action/Plan: Discharging home today with self care. No CM needs noted at this time.   Expected Discharge Date:  07/07/17               Expected Discharge Plan:  Home/Self Care  In-House Referral:  NA  Discharge planning Services  NA  Post Acute Care Choice:  NA Choice offered to:  NA  Status of Service:  Completed, signed off  Sherald Barge, RN 07/07/2017, 1:39 PM

## 2017-07-07 NOTE — Discharge Summary (Addendum)
Physician Discharge Summary  Joy Yoder YPP:[id number] DOB: [birth year] DOA: 07/04/2017  PCP: Patient, No Pcp Per  Admit date: 07/04/2017 Discharge date: 07/07/2017  Admitted From: Home Disposition:  Home  Recommendations for Outpatient Follow-up:  1. Follow up with pain clinic this afternoon. 2. Follow-up with PCP in 1 week. Please titrate blood pressure medications.  Home Health: None Equipment/Devices: None  Discharge Condition: Fair CODE STATUS: Full code Diet recommendation: Diabetic/low sodium    Discharge Diagnoses:  Principal Problem: Acute kidney injury on chronic kidney disease stage III ([place])   Active Problems:   Intractable pain   Uncontrolled Hypertension   Type 2 diabetes mellitus without complication (HCC)   Intractable nausea and vomiting   Chronic pain syndrome  Brief narrative/history of present illness Please refer to admission H&P for details, in brief, 51 year old female with history of CHF, chronic pain syndrome on morphine pump, chronic kidney disease stage III (Baseline creatinine around 1.4 per patient), hypertension presented to the ED on 12/1 with complaint of running out of her pain pump or 3 weeks back. Reported that her pump has been beeping for the past 2 weeks. She missed her appointment in the pain clinic (in [facility]) recently. On the morning of admission patient started feeling itchy all over associated with significant nausea and vomiting, chest discomfort and thus called EMS. Patient reported following up with the pain clinic about once a year. Patient denies any sick contact, but reported feeling sweaty and having some chills last few days. Reportedly patient was seen in [facility] ED for similar symptoms but left from the ED stating she was not treated properly. Next line neck line in the ED patient was given IV morphine. Vitals were stable. Blood work showed acute kidney injury with creatinine of 2.46. X-ray was negative  for small bowel obstruction. UA was unremarkable. Patient initially placed on observation for acute kidney injury and hydration.  Hospital course Intractable chronic pain Suspected due to withdrawal from morphine. Patient reportedly on morphine pump for past 5 years. Patient was placed on when necessary IV Dilaudid and morphine. Her home dose of Cymbalta and Neurontin were resumed. Patient's nausea and vomiting have resolved and tolerating advanced diet. Patient instructed to schedule appointment with her pain clinic today. Patient tells me that she has already contacted them and was told to come in as a walk-in.  Patient stable to be discharged home and follow-up at her pain clinic this afternoon. Neck line   Acute renal failure superimposed on chronic kidney disease stage III [facility] secondary to dehydration with nausea and vomiting. Patient is also on Lasix and losartan which may have worsened the symptoms. Renal ultrasound unremarkable. Renal function has improved with IV fluids and at baseline this morning.   uncontrolled hypertension  patient is on multiple blood pressure medications including amlodipine, Coreg, Lasix, hydralazine and losartan. All of these have been resumed. Blood pressure elevated likely associated with pain as well. Needs to be titrated as outpatient.   type 2 diabetes mellitus, controlled Continue Lantus daily. A1c of 6.9. Outpatient follow-up.   Procedure: None Family communication: None at bedside Consults: None   Disposition: Home   Discharge Instructions   Allergies as of 07/07/2017      Reactions   Metformin And Related    Diarrhea   Nsaids    Kidney disease   Pravastatin    Swelling   Reglan [metoclopramide]    shakes   Tramadol    itching  Medication List    TAKE these medications   albuterol 108 (90 Base) MCG/ACT inhaler Commonly known as:  PROVENTIL HFA;VENTOLIN HFA Inhale 2 puffs into the lungs every 4 (four) hours as needed  for wheezing or shortness of breath.   amLODipine 10 MG tablet Commonly known as:  NORVASC Take 10 mg by mouth daily.   atorvastatin 80 MG tablet Commonly known as:  LIPITOR Take 80 mg by mouth daily at 6 PM.   carvedilol 6.25 MG tablet Commonly known as:  COREG Take 6.25 mg by mouth 2 (two) times daily with a meal.   furosemide 40 MG tablet Commonly known as:  LASIX Take 1 tablet (40 mg total) by mouth daily.   gabapentin 600 MG tablet Commonly known as:  NEURONTIN Take 600 mg by mouth 3 (three) times daily as needed (pain).   hydrALAZINE 50 MG tablet Commonly known as:  APRESOLINE Take 50 mg by mouth 3 (three) times daily.   hydrOXYzine 50 MG tablet Commonly known as:  ATARAX/VISTARIL Take 50 mg by mouth daily as needed for anxiety.   insulin lispro 100 UNIT/ML injection Commonly known as:  HUMALOG Inject 3-4 Units into the skin 3 (three) times daily before meals. Patient injects according to sliding scale at home.   isosorbide mononitrate 30 MG 24 hr tablet Commonly known as:  IMDUR Take 30 mg by mouth daily.   losartan 100 MG tablet Commonly known as:  COZAAR Take 100 mg by mouth daily.   mirtazapine 30 MG tablet Commonly known as:  REMERON Take 30 mg by mouth at bedtime.   ondansetron 4 MG disintegrating tablet Commonly known as:  ZOFRAN-ODT Take 4 mg by mouth 3 (three) times daily as needed for nausea or vomiting.   silver sulfADIAZINE 1 % cream Commonly known as:  SILVADENE Apply 1 application topically daily.   TOUJEO MAX SOLOSTAR 300 UNIT/ML Sopn Generic drug:  Insulin Glargine Inject 60 Units into the skin daily.   venlafaxine XR 150 MG 24 hr capsule Commonly known as:  EFFEXOR-XR Take 150 mg by mouth daily with breakfast.      Follow-up Information    pain clinic in [place] Follow up.          Allergies  Allergen Reactions  . Metformin And Related     Diarrhea   . Nsaids     Kidney disease  . Pravastatin     Swelling   .  Reglan [Metoclopramide]     shakes  . Tramadol     itching        Procedures/Studies: Ct Abdomen Pelvis Wo Contrast  Result Date: 07/04/2017 CLINICAL DATA:  Lower abdominal pain. EXAM: CT ABDOMEN AND PELVIS WITHOUT CONTRAST TECHNIQUE: Multidetector CT imaging of the abdomen and pelvis was performed following the standard protocol without IV contrast. COMPARISON:  KUB from earlier today FINDINGS: Lower chest: No acute abnormality. Hepatobiliary: The patient is status post cholecystectomy. No liver masses are identified. Mild prominence of the common bile duct is likely due to previous cholecystectomy. Pancreas: Unremarkable. No pancreatic ductal dilatation or surrounding inflammatory changes. Spleen: Normal in size without focal abnormality. Adrenals/Urinary Tract: Adrenal glands are normal. No renal stones identified. Mild pelvicaliectasis bilaterally, right greater than left without significant perinephric stranding. Mild prominence of the ureters, also right greater than left with no obvious stones. The ureters are normal in caliber distally. The most distal aspect of the right ureter is not well seen. The bladder is distended but thin walled. Stomach/Bowel: The  stomach and small bowel are normal. Mild fecal loading in the colon. The colon is otherwise normal in appearance. The appendix is not seen but there is no secondary evidence of appendicitis. Vascular/Lymphatic: Mild atherosclerotic change seen in the nonaneurysmal aorta. Prominent inguinal node, left greater than right. A representative left inguinal node measures up to 15 mm in short axis. No other adenopathy. Reproductive: Status post hysterectomy. No adnexal masses. Other: A spinal stimulator device terminates at the T12-L1 level. No free air or free fluid. Musculoskeletal: No acute or significant osseous findings. IMPRESSION: 1. There is distention of the bladder which is thin walled. Whether the bladder distention is voluntary or due to  bladder outlet obstruction, either function or mechanical, cannot be determined on this study. The distention of the bladder may explain the right greater than left pelvicaliectasis and mild ureterectasis. No obvious ureteral stones identified. 2. The appendix is not seen but there is no secondary evidence of appendicitis. 3. Mild fecal loading in the colon. 4. Mild atherosclerotic change in the nonaneurysmal aorta. 5. Prominent inguinal nodes with the largest on the left measuring 15 mm. These are nonspecific. Recommend clinical correlation and attention on follow-up. 6. No other acute abnormalities. Aortic Atherosclerosis (ICD10-I70.0). Electronically Signed   By: Dorise Bullion III M.D   On: 07/04/2017 16:05   Dg Chest 2 View  Result Date: 07/04/2017 CLINICAL DATA:  Shortness of breath. EXAM: CHEST  2 VIEW COMPARISON:  Radiographs 06/07/2011 FINDINGS: Low lung volumes. The cardiomediastinal contours are normal. The lungs are clear. Pulmonary vasculature is normal. No consolidation, pleural effusion, or pneumothorax. No acute osseous abnormalities are seen. IMPRESSION: Hypoventilatory chest without acute abnormality. Electronically Signed   By: Jeb Levering M.D.   On: 07/04/2017 04:05   Dg Abd 1 View  Result Date: 07/05/2017 CLINICAL DATA:  Abdominal pain. EXAM: ABDOMEN - 1 VIEW COMPARISON:  07/04/2017 FINDINGS: Nonspecific bowel gas pattern without evidence small bowel obstruction. Stool is seen scattered along the colon. Morphine lump device overlies the right lower abdomen. Visualized bony anatomy unremarkable. IMPRESSION: Stable.  No evidence for bowel obstruction. Electronically Signed   By: Misty Stanley M.D.   On: 07/05/2017 10:32   US Renal  Result Date: 07/05/2017 CLINICAL DATA:  Acute renal failure. EXAM: RENAL / URINARY TRACT ULTRASOUND COMPLETE COMPARISON:  CT of the abdomen without contrast on 07/04/2017 FINDINGS: Right Kidney: Length: 10.1 cm. Echogenicity within normal limits. No  mass or hydronephrosis visualized. Left Kidney: Length: 10.9 cm. Echogenicity within normal limits. No mass or hydronephrosis visualized. Mild amount of perinephric fluid adjacent to the upper pole. Bladder: Bladder is significantly distended. IMPRESSION: No significant renal atrophy or evidence of hydronephrosis. The bladder is significantly distended. Electronically Signed   By: Aletta Edouard M.D.   On: 07/05/2017 10:37   Dg Abd 2 Views  Result Date: 07/04/2017 CLINICAL DATA:  Abdominal pain. EXAM: ABDOMEN - 2 VIEW COMPARISON:  None. FINDINGS: No bowel dilatation to suggest obstruction. No evidence of free air. Small to moderate colonic stool burden. No radiopaque calculi. Cholecystectomy clips in the right upper quadrant. Stimulator with battery pack projecting over the right lower abdomen. No acute osseous abnormalities. IMPRESSION: Normal bowel gas pattern. No free air. Small to moderate colonic stool burden. Electronically Signed   By: Jeb Levering M.D.   On: 07/04/2017 05:32       Subjective: As further nausea and vomiting. Pain control on current regimen.  Discharge Exam: Vitals:   07/06/17 2319 07/07/17 0613  BP: Marland Kitchen)  201/89 (!) 192/83  Pulse: 89 91  Resp: 14 16  Temp: 99 F (37.2 C) 98.4 F (36.9 C)  SpO2: 100% 100%   Vitals:   07/05/17 2130 07/06/17 2030 07/06/17 2319 07/07/17 0613  BP: (!) 198/82  (!) 201/89 (!) 192/83  Pulse: (!) 107  89 91  Resp: 16  14 16   Temp: 99.2 F (37.3 C)  99 F (37.2 C) 98.4 F (36.9 C)  TempSrc: Oral  Oral Oral  SpO2: 100% 99% 100% 100%  Weight:      Height:        General: Middle aged female not in distress HEENT: No pallor, no icterus, moist oral mucosa, supple neck Chest: Clear to auscultation bilaterally CVS: Normal S1 and S2, no murmurs rub or gallop  GI: Soft, nondistended, nontender, bowel sounds present Musculoskeletal: Warm, no edema    The results of significant diagnostics from this hospitalization (including  imaging, microbiology, ancillary and laboratory) are listed below for reference.     Microbiology: No results found for this or any previous visit (from the past 240 hour(s)).   Labs: BNP (last 3 results) Recent Labs    07/04/17 0214  BNP 49.4   Basic Metabolic Panel: Recent Labs  Lab 07/04/17 0214 07/05/17 0437 07/06/17 1132 07/07/17 0851  NA 141 140 140 141  K 4.7 4.1 4.3 3.5  CL 112* 110 111 114*  CO2 23 20* 22 22  GLUCOSE 112* 253* 168* 88  BUN 40* 33* 34* 30*  CREATININE 2.40* 1.70* 1.53* 1.38*  CALCIUM 9.7 9.4 9.4 8.9   Liver Function Tests: No results for input(s): AST, ALT, ALKPHOS, BILITOT, PROT, ALBUMIN in the last 168 hours. No results for input(s): LIPASE, AMYLASE in the last 168 hours. No results for input(s): AMMONIA in the last 168 hours. CBC: Recent Labs  Lab 07/04/17 0214 07/06/17 1132  WBC 7.9 12.6*  NEUTROABS 3.5  --   HGB 10.9* 10.5*  HCT 36.0 34.6*  MCV 82.8 83.0  PLT 251 189   Cardiac Enzymes: Recent Labs  Lab 07/04/17 0214  TROPONINI <0.03   BNP: Invalid input(s): POCBNP CBG: Recent Labs  Lab 07/06/17 1135 07/06/17 1653 07/06/17 2313 07/07/17 0749 07/07/17 1105  GLUCAP 158* 121* 94 88 91   D-Dimer No results for input(s): DDIMER in the last 72 hours. Hgb A1c Recent Labs    07/04/17 1654  HGBA1C 6.9*   Lipid Profile No results for input(s): CHOL, HDL, LDLCALC, TRIG, CHOLHDL, LDLDIRECT in the last 72 hours. Thyroid function studies No results for input(s): TSH, T4TOTAL, T3FREE, THYROIDAB in the last 72 hours.  Invalid input(s): FREET3 Anemia work up No results for input(s): VITAMINB12, FOLATE, FERRITIN, TIBC, IRON, RETICCTPCT in the last 72 hours. Urinalysis    Component Value Date/Time   COLORURINE YELLOW 07/04/2017 0528   APPEARANCEUR CLEAR 07/04/2017 0528   LABSPEC 1.015 07/04/2017 0528   PHURINE 7.0 07/04/2017 0528   GLUCOSEU NEGATIVE 07/04/2017 0528   HGBUR NEGATIVE 07/04/2017 0528   BILIRUBINUR NEGATIVE  07/04/2017 0528   KETONESUR NEGATIVE 07/04/2017 0528   PROTEINUR 100 (A) 07/04/2017 0528   UROBILINOGEN 0.2 06/07/2011 1420   NITRITE NEGATIVE 07/04/2017 0528   LEUKOCYTESUR NEGATIVE 07/04/2017 0528   Sepsis Labs Invalid input(s): PROCALCITONIN,  WBC,  LACTICIDVEN Microbiology No results found for this or any previous visit (from the past 240 hour(s)).   Time coordinating discharge: > 30 minutes  SIGNED:   Louellen Molder, MD  [facility] 07/07/2017, 1:07 PM Pager  If 7PM-7AM, please contact night-coverage [email or web address] Password [email or web address]

## 2018-10-28 ENCOUNTER — Other Ambulatory Visit (HOSPITAL_COMMUNITY): Payer: 59 | Admitting: Internal Medicine

## 2018-10-28 DIAGNOSIS — G894 Chronic pain syndrome: Secondary | ICD-10-CM

## 2018-10-28 DIAGNOSIS — J9621 Acute and chronic respiratory failure with hypoxia: Secondary | ICD-10-CM

## 2018-10-28 DIAGNOSIS — N179 Acute kidney failure, unspecified: Secondary | ICD-10-CM

## 2018-10-28 DIAGNOSIS — N183 Chronic kidney disease, stage 3 unspecified: Secondary | ICD-10-CM

## 2018-10-28 DIAGNOSIS — I5032 Chronic diastolic (congestive) heart failure: Secondary | ICD-10-CM | POA: Diagnosis not present

## 2018-10-28 DIAGNOSIS — J449 Chronic obstructive pulmonary disease, unspecified: Secondary | ICD-10-CM

## 2018-10-28 DIAGNOSIS — J181 Lobar pneumonia, unspecified organism: Secondary | ICD-10-CM

## 2018-10-29 ENCOUNTER — Other Ambulatory Visit (HOSPITAL_COMMUNITY): Payer: 59 | Admitting: Internal Medicine

## 2018-10-29 DIAGNOSIS — I5032 Chronic diastolic (congestive) heart failure: Secondary | ICD-10-CM

## 2018-10-29 DIAGNOSIS — J449 Chronic obstructive pulmonary disease, unspecified: Secondary | ICD-10-CM

## 2018-10-29 DIAGNOSIS — G894 Chronic pain syndrome: Secondary | ICD-10-CM | POA: Diagnosis not present

## 2018-10-29 DIAGNOSIS — N183 Chronic kidney disease, stage 3 unspecified: Secondary | ICD-10-CM

## 2018-10-29 DIAGNOSIS — J181 Lobar pneumonia, unspecified organism: Secondary | ICD-10-CM

## 2018-10-29 DIAGNOSIS — J9621 Acute and chronic respiratory failure with hypoxia: Secondary | ICD-10-CM

## 2018-10-29 NOTE — Progress Notes (Signed)
[place]  Date of Service: 10/28/2018  PULMONARY CONSULT   Ayva Veilleux  NAT:[id number]  DOB: 10/17/1965     Referring Physician: Deanne Coffer, MD  HPI: Joy Yoder is a 53 y.o. female seen for Acute on Chronic Respiratory Failure.  Patient has multiple medical problems including degenerative arthritic this congestive heart failure diabetes mellitus hypertension chronic kidney disease stage III history of stroke.  Basically presented to the hospital acute respiratory failure.  The patient had to be intubated was found to have pneumonia and pulmonary edema and suspected ARDS.  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  Tracheostomy was done by ENT as a consequence.  Patient was treated for pneumonia and was given broad-spectrum antibiotics including Zosyn and vancomycin.  She did also develop a GI bleed requiring transfusion.  Other issues included the chronic kidney disease she was given rehydration and showed some improvement in her baseline labs.  She is now transferred to our facility for further management.  She was eventually weaned down onto T collar.  Review of Systems:  ROS performed and is unremarkable other than noted above.  Past Medical History:  Diagnosis Date  . Anxiety   . CHF (congestive heart failure) ([place])   . DDD (degenerative disc disease)   . DDD (degenerative disc disease), thoracic    chest to back, legs,   . Diabetes mellitus   . DJD (degenerative joint disease)   . Hypertension   . Kidney disease    stage 3   . Neuropathy   . Stroke [facility])     Past Surgical History:  Procedure Laterality Date  . ABDOMINAL HYSTERECTOMY    . bladder tack    . CHOLECYSTECTOMY    . EYE SURGERY    . THYROID SURGERY      Social History:    reports that she has never smoked. She does not have any smokeless tobacco history on file. She reports current alcohol use. She reports  that she does not use drugs.  Family History: Non-Contributory to the present illness  Allergies  Reviewed on the [facility]  Medications: Reviewed on Rounds  Physical Exam:  Vitals: Temperature 96.4 pulse 80 respiratory rate 20 blood pressure was 160/70 saturations 98%  Ventilator Settings off the ventilator on T collar  . General: Comfortable at this time . Eyes: Grossly normal lids, irises & conjunctiva . ENT: grossly tongue is normal . Neck: no obvious mass . Cardiovascular: S1-S2 normal no gallop or rub . Respiratory: Scattered rhonchi expansion is equal . Abdomen: Morbidly obese soft . Skin: no rash seen on limited exam . Musculoskeletal: not rigid . Psychiatric:unable to assess . Neurologic: no seizure no involuntary movements         Labs on Admission:  White count 7.2 hemoglobin 8.3 hematocrit 27.0 platelet count 207 Sodium 141 potassium 3.8 BUN 11 creatinine 1.4  Radiological Exams on Admission: *XR CHEST SINGLE VIEW PORTABLE  History: Lung Aeration, J96.01 Acute respiratory failure with hypoxia (CMS-HCC)  Portable erect chest at 10/20/2018 9:30 PM hours.   Comparison: previous  Cardiomegaly stable. Tracheostomy, feeding tube unchanged. Right central venous catheter has been removed. No pneumothorax.  Stable lung parenchymal pattern with reduced lung volumes and increased interstitial, bronchovascular markings.    Electronically Signed by: Sarita Bottom, MD, [facility] Electronically Signed on: 10/20/2018 9:50 PM  Assessment/Plan Patient Active Problem List   Diagnosis Date Noted  . Acute on chronic respiratory  failure with hypoxia ([place])   . Chronic diastolic heart failure ([place])   . Chronic kidney disease, stage III (moderate) (HCC)   . COPD, severe ([place])   . Lobar pneumonia ([place])   . AKI (acute kidney injury) ([place]) 07/07/2017  . Chronic pain syndrome 07/07/2017  . Intractable nausea and vomiting 07/06/2017  . Intractable pain 07/04/2017  .  Hypertension 07/04/2017  . Type 2 diabetes mellitus without complication ([place]) 42/05/3127     1. Acute on chronic respiratory failure with hypoxia patient currently is on T collar has been weaning will try the PMV see how she is able to tolerate that. 2. Chronic kidney disease stage III patient's labs will be followed along.  Monitor fluid status. 3. Chronic diastolic heart failure currently is compensated we will continue with supportive care.  Monitor fluid status diuretics as necessary. 4. Severe COPD nebulizers as necessary 5. Lobar pneumonia treated empirically we will continue to monitor radiologically. 6. Chronic pain syndrome continue with supportive care  I have personally seen and evaluated the patient, evaluated laboratory and imaging results, formulated the assessment and plan and placed orders. The Patient requires high complexity decision making for assessment and support.  Case was discussed on Rounds with the Respiratory Therapy Staff Time Spent 25minutes  Allyne Gee, MD [facility]

## 2018-10-29 NOTE — Progress Notes (Signed)
[place] NOTE  PULMONARY SERVICE ROUNDS  Date of Service: 10/29/2018  Joy Yoder  DOB: [birth year]  Referring physician: Deanne Coffer, MD  HPI: Joy Yoder is a 53 y.o. female  being seen for Acute on Chronic Respiratory Failure.  Patient is comfortable right now without distress has been on T collar 28% FiO2 using the PMV for about 12 hours  Review of Systems: Unremarkable other than noted in HPI  Allergies:  Reviewed on the [facility]  Medications: Reviewed  Vitals: Temperature 97.4 pulse 84 respiratory 20 blood pressure 150/68 saturations 96%  Ventilator Settings: Patient is off the ventilator on T collar right now with 28% FiO2  Physical Exam: . General:  calm and comfortable NAD . Eyes: normal lids, irises & conjunctiva . ENT: grossly normal tongue not enlarged . Neck: no masses . Cardiovascular: S1 S2 Normal no rubs no gallop . Respiratory: No rhonchi or rales are noted at this time . Abdomen: soft non-distended . Skin: no rash seen on limited exam . Musculoskeletal:  no rigidity . Psychiatric: unable to assess . Neurologic: no involuntary movements          Lab Data and radiological Data:  No labs to report today   Assessment/Plan  Patient Active Problem List   Diagnosis Date Noted  . Acute on chronic respiratory failure with hypoxia ([place])   . Chronic diastolic heart failure ([place])   . Chronic kidney disease, stage III (moderate) (HCC)   . COPD, severe ([place])   . Lobar pneumonia ([place])   . AKI (acute kidney injury) ([place]) 07/07/2017  . Chronic pain syndrome 07/07/2017  . Intractable nausea and vomiting 07/06/2017  . Intractable pain 07/04/2017  . Hypertension 07/04/2017  . Type 2 diabetes mellitus without complication ([place]) 63/08/6008      1. Acute on chronic respiratory failure hypoxia patient is doing fairly well weaning right now T collar will try to continue to advance that wean.  Patient is also been tolerating  PMV.  Continue with pulmonary toilet supportive care. 2. Chronic diastolic heart failure appears to be compensated we will continue with present management 3. Chronic kidney disease stage III moderate disease we will continue with supportive care 4. Severe COPD at baseline 5. Lobar pneumonia treated clinically improving we will continue to follow along. 6. Chronic pain management per primary care team   I have personally evaluated the patient, evaluated the laboratory and imaging results and formulated the assessment and plan and placed orders as needed. The Patient requires high complexity decision making for assessment and support. I have discussed the patient on rounds with the Respiratory Staff   Allyne Gee, MD [facility] Pulmonary Critical Care Medicine

## 2018-10-30 ENCOUNTER — Other Ambulatory Visit (HOSPITAL_COMMUNITY): Payer: 59 | Admitting: Internal Medicine

## 2018-10-30 DIAGNOSIS — N183 Chronic kidney disease, stage 3 unspecified: Secondary | ICD-10-CM

## 2018-10-30 DIAGNOSIS — J449 Chronic obstructive pulmonary disease, unspecified: Secondary | ICD-10-CM

## 2018-10-30 DIAGNOSIS — I5032 Chronic diastolic (congestive) heart failure: Secondary | ICD-10-CM

## 2018-10-30 DIAGNOSIS — J9621 Acute and chronic respiratory failure with hypoxia: Secondary | ICD-10-CM | POA: Diagnosis not present

## 2018-10-30 DIAGNOSIS — G894 Chronic pain syndrome: Secondary | ICD-10-CM

## 2018-10-30 DIAGNOSIS — J181 Lobar pneumonia, unspecified organism: Secondary | ICD-10-CM

## 2018-10-30 NOTE — Progress Notes (Signed)
[place] NOTE  PULMONARY SERVICE ROUNDS  Date of Service: 10/30/2018  Joy Yoder  DOB: Apr 08, 1966  Referring physician: Deanne Coffer, MD  HPI: Joy Yoder is a 53 y.o. female  being seen for Acute on Chronic Respiratory Failure.  Patient is comfortable without distress remains on T collar currently is on 28% FiO2  Review of Systems: Unremarkable other than noted in HPI  Allergies:  Reviewed on the [facility]  Medications: Reviewed  Vitals: Pitcher 98.0 pulse 76 respiratory rate 18 blood pressure 110/56 saturations 97%  Ventilator Settings: Currently is off the ventilator on T collar has been on 28% FiO2 with PMV  Physical Exam: . General:  calm and comfortable NAD . Eyes: normal lids, irises & conjunctiva . ENT: grossly normal tongue not enlarged . Neck: no masses . Cardiovascular: S1 S2 Normal no rubs no gallop . Respiratory: No rhonchi or rales are noted at this time. . Abdomen: soft non-distended . Skin: no rash seen on limited exam . Musculoskeletal:  no rigidity . Psychiatric: unable to assess . Neurologic: no involuntary movements          Lab Data and radiological Data:  No labs to report today   Assessment/Plan  Patient Active Problem List   Diagnosis Date Noted  . Acute on chronic respiratory failure with hypoxia ([place])   . Chronic diastolic heart failure ([place])   . Chronic kidney disease, stage III (moderate) (HCC)   . COPD, severe ([place])   . Lobar pneumonia ([place])   . AKI (acute kidney injury) ([place]) 07/07/2017  . Chronic pain syndrome 07/07/2017  . Intractable nausea and vomiting 07/06/2017  . Intractable pain 07/04/2017  . Hypertension 07/04/2017  . Type 2 diabetes mellitus without complication ([place]) 34/91/7915      1. Acute on chronic respiratory failure with hypoxia continue with PMV trials continue with secretion management supportive care. 2. Chronic diastolic heart failure compensated she is actually  doing fairly well. 3. Chronic kidney disease stage III continue to monitor labs 4. Severe COPD at baseline 5. Lobar pneumonia treated clinically improved we will continue to follow 6. Chronic pain management patient is at baseline   I have personally evaluated the patient, evaluated the laboratory and imaging results and formulated the assessment and plan and placed orders as needed. The Patient requires high complexity decision making for assessment and support. I have discussed the patient on rounds with the Respiratory Staff   Allyne Gee, MD [facility] Pulmonary Critical Care Medicine

## 2018-10-31 ENCOUNTER — Encounter: Payer: Self-pay | Admitting: Internal Medicine

## 2018-10-31 ENCOUNTER — Other Ambulatory Visit (HOSPITAL_COMMUNITY): Payer: 59 | Admitting: Internal Medicine

## 2018-10-31 DIAGNOSIS — I5032 Chronic diastolic (congestive) heart failure: Secondary | ICD-10-CM | POA: Diagnosis not present

## 2018-10-31 DIAGNOSIS — N183 Chronic kidney disease, stage 3 unspecified: Secondary | ICD-10-CM

## 2018-10-31 DIAGNOSIS — G894 Chronic pain syndrome: Secondary | ICD-10-CM | POA: Diagnosis not present

## 2018-10-31 DIAGNOSIS — J9621 Acute and chronic respiratory failure with hypoxia: Secondary | ICD-10-CM

## 2018-10-31 DIAGNOSIS — J449 Chronic obstructive pulmonary disease, unspecified: Secondary | ICD-10-CM

## 2018-10-31 DIAGNOSIS — J181 Lobar pneumonia, unspecified organism: Secondary | ICD-10-CM | POA: Insufficient documentation

## 2018-10-31 NOTE — Progress Notes (Signed)
[place] NOTE  PULMONARY SERVICE ROUNDS  Date of Service: 10/31/2018  Banita Lehn  DOB: [birth year]  Referring physician: Deanne Coffer, MD  HPI: Joy Yoder is a 53 y.o. female  being seen for Acute on Chronic Respiratory Failure.  She is sitting in the chair capping doing very well awake and alert.  She had questions regarding her tracheostomy and she also told me that she would like to have it removed when it is time.  She had questions about the procedure of removing it which was explained to her.  Review of Systems: Unremarkable other than noted in HPI  Allergies:  Reviewed on the [facility]  Medications: Reviewed  Vitals: Temperature 96.1 pulse 74 respiratory rate 16 blood pressure 127/67 saturations 99%  Ventilator Settings: Capping right now for 24 hours  Physical Exam: . General:  calm and comfortable NAD . Eyes: normal lids, irises & conjunctiva . ENT: grossly normal tongue not enlarged . Neck: no masses . Cardiovascular: S1 S2 Normal no rubs no gallop . Respiratory: No rhonchi or rales are noted at this time . Abdomen: soft non-distended . Skin: no rash seen on limited exam . Musculoskeletal:  no rigidity . Psychiatric: unable to assess . Neurologic: no involuntary movements          Lab Data and radiological Data:  Data has been reviewed   Assessment/Plan  Patient Active Problem List   Diagnosis Date Noted  . Acute on chronic respiratory failure with hypoxia ([place])   . Chronic diastolic heart failure ([place])   . Chronic kidney disease, stage III (moderate) (HCC)   . COPD, severe ([place])   . Lobar pneumonia ([place])   . AKI (acute kidney injury) ([place]) 07/07/2017  . Chronic pain syndrome 07/07/2017  . Intractable nausea and vomiting 07/06/2017  . Intractable pain 07/04/2017  . Hypertension 07/04/2017  . Type 2 diabetes mellitus without complication ([place]) 61/68/3729      1. Acute on chronic respiratory failure with  hypoxia patient is doing well with capping trials at this time.  Has been off capping right now for 24 hours.  Plan continue as tolerated 2. Chronic diastolic heart failure compensated we will continue with supportive care 3. Chronic kidney disease stage III at baseline we will continue present management and follow labs 4. Severe COPD she is doing well.  She also told me that there is a family history of sarcoidosis which might need to be worked up after discharge 5. Lobar pneumonia treated clinically improved 6. Chronic pain syndrome continue with supportive care   I have personally evaluated the patient, evaluated the laboratory and imaging results and formulated the assessment and plan and placed orders as needed. The Patient requires high complexity decision making for assessment and support. I have discussed the patient on rounds with the Respiratory Staff   Allyne Gee, MD [facility] Pulmonary Critical Care Medicine

## 2018-11-01 ENCOUNTER — Other Ambulatory Visit (HOSPITAL_COMMUNITY): Payer: 59 | Admitting: Internal Medicine

## 2018-11-01 DIAGNOSIS — G894 Chronic pain syndrome: Secondary | ICD-10-CM

## 2018-11-01 DIAGNOSIS — J181 Lobar pneumonia, unspecified organism: Secondary | ICD-10-CM

## 2018-11-01 DIAGNOSIS — N183 Chronic kidney disease, stage 3 unspecified: Secondary | ICD-10-CM

## 2018-11-01 DIAGNOSIS — J449 Chronic obstructive pulmonary disease, unspecified: Secondary | ICD-10-CM

## 2018-11-01 DIAGNOSIS — N179 Acute kidney failure, unspecified: Secondary | ICD-10-CM | POA: Diagnosis not present

## 2018-11-01 DIAGNOSIS — J9621 Acute and chronic respiratory failure with hypoxia: Secondary | ICD-10-CM

## 2018-11-01 DIAGNOSIS — I5032 Chronic diastolic (congestive) heart failure: Secondary | ICD-10-CM | POA: Diagnosis not present

## 2018-11-01 NOTE — Progress Notes (Signed)
[place] NOTE  PULMONARY SERVICE ROUNDS  Date of Service: 11/01/2018  Chrisanna Mishra  DOB: Sep 07, 1965  Referring physician: Deanne Coffer, MD  HPI: Atonya Templer is a 53 y.o. female  being seen for Acute on Chronic Respiratory Failure.  She is sitting up in the chair comfortable right now without distress has been capping  Review of Systems: Unremarkable other than noted in HPI  Allergies:  Reviewed on the [facility]  Medications: Reviewed  Vitals: Temperature 96.0 pulse 76 respiratory rate 20 blood pressure 110/50 saturations 100%  Ventilator Settings: Capping off the ventilator  Physical Exam: . General:  calm and comfortable NAD . Eyes: normal lids, irises & conjunctiva . ENT: grossly normal tongue not enlarged . Neck: no masses . Cardiovascular: S1 S2 Normal no rubs no gallop . Respiratory: No rhonchi or rales are noted at this time . Abdomen: soft non-distended . Skin: no rash seen on limited exam . Musculoskeletal:  no rigidity . Psychiatric: unable to assess . Neurologic: no involuntary movements          Lab Data and radiological Data:  No labs to report   Assessment/Plan  Patient Active Problem List   Diagnosis Date Noted  . Acute on chronic respiratory failure with hypoxia ([place])   . Chronic diastolic heart failure ([place])   . Chronic kidney disease, stage III (moderate) (HCC)   . COPD, severe ([place])   . Lobar pneumonia ([place])   . AKI (acute kidney injury) ([place]) 07/07/2017  . Chronic pain syndrome 07/07/2017  . Intractable nausea and vomiting 07/06/2017  . Intractable pain 07/04/2017  . Hypertension 07/04/2017  . Type 2 diabetes mellitus without complication ([place]) 97/41/6384      1. Acute on chronic respiratory failure with hypoxia continue with capping trials holding off on decannulation until ENT notes are clarified will discuss with primary care team 2. Chronic diastolic heart failure compensated continue with  supportive care 3. Chronic kidney disease stage III continue to monitor labs 4. Severe COPD at baseline 5. Lobar pneumonia treated improved 6. Chronic pain syndrome continue with pain management   I have personally evaluated the patient, evaluated the laboratory and imaging results and formulated the assessment and plan and placed orders as needed. The Patient requires high complexity decision making for assessment and support. I have discussed the patient on rounds with the Respiratory Staff   Allyne Gee, MD [facility] Pulmonary Critical Care Medicine

## 2018-11-02 ENCOUNTER — Other Ambulatory Visit (HOSPITAL_COMMUNITY): Payer: 59 | Admitting: Internal Medicine

## 2018-11-02 DIAGNOSIS — I5032 Chronic diastolic (congestive) heart failure: Secondary | ICD-10-CM

## 2018-11-02 DIAGNOSIS — N183 Chronic kidney disease, stage 3 unspecified: Secondary | ICD-10-CM

## 2018-11-02 DIAGNOSIS — N179 Acute kidney failure, unspecified: Secondary | ICD-10-CM | POA: Diagnosis not present

## 2018-11-02 DIAGNOSIS — J181 Lobar pneumonia, unspecified organism: Secondary | ICD-10-CM

## 2018-11-02 DIAGNOSIS — J449 Chronic obstructive pulmonary disease, unspecified: Secondary | ICD-10-CM

## 2018-11-02 DIAGNOSIS — G894 Chronic pain syndrome: Secondary | ICD-10-CM

## 2018-11-02 DIAGNOSIS — J9621 Acute and chronic respiratory failure with hypoxia: Secondary | ICD-10-CM | POA: Diagnosis not present

## 2018-11-02 NOTE — Progress Notes (Signed)
[place] NOTE  PULMONARY SERVICE ROUNDS  Date of Service: 11/02/2018  Karilynn Carranza  DOB: [birth year]  Referring physician: Deanne Coffer, MD  HPI: Joy Yoder is a 53 y.o. female  being seen for Acute on Chronic Respiratory Failure.  Comfortable without distress at this time patient is on room air  Review of Systems: Unremarkable other than noted in HPI  Allergies:  Reviewed on the [facility]  Medications: Reviewed  Vitals: Temperature 96.4 pulse 76 respiratory 20 blood pressure 120/50 saturations 96%  Ventilator Settings: On room air at this time  Physical Exam: . General:  calm and comfortable NAD . Eyes: normal lids, irises & conjunctiva . ENT: grossly normal tongue not enlarged . Neck: no masses . Cardiovascular: S1 S2 Normal no rubs no gallop . Respiratory: No rhonchi or rales are noted . Abdomen: soft non-distended . Skin: no rash seen on limited exam . Musculoskeletal:  no rigidity . Psychiatric: unable to assess . Neurologic: no involuntary movements          Lab Data and radiological Data:  No labs to report   Assessment/Plan  Patient Active Problem List   Diagnosis Date Noted  . Acute on chronic respiratory failure with hypoxia ([place])   . Chronic diastolic heart failure ([place])   . Chronic kidney disease, stage III (moderate) (HCC)   . COPD, severe ([place])   . Lobar pneumonia ([place])   . AKI (acute kidney injury) ([place]) 07/07/2017  . Chronic pain syndrome 07/07/2017  . Intractable nausea and vomiting 07/06/2017  . Intractable pain 07/04/2017  . Hypertension 07/04/2017  . Type 2 diabetes mellitus without complication ([place]) 99/37/1696      1. Acute on chronic respiratory failure with hypoxia we will continue with the T collar trials continue secretion management supportive care. 2. Chronic diastolic heart failure baseline 3. Chronic kidney disease stage III monitor labs 4. Severe COPD at baseline continue present  management 5. Lobar pneumonia treated clinically improving 6. Chronic pain syndrome continue with supportive care   I have personally evaluated the patient, evaluated the laboratory and imaging results and formulated the assessment and plan and placed orders as needed. The Patient requires high complexity decision making for assessment and support. I have discussed the patient on rounds with the Respiratory Staff   Allyne Gee, MD [facility] Pulmonary Critical Care Medicine

## 2018-11-03 ENCOUNTER — Other Ambulatory Visit (HOSPITAL_COMMUNITY): Payer: 59 | Admitting: Internal Medicine

## 2018-11-03 DIAGNOSIS — N183 Chronic kidney disease, stage 3 unspecified: Secondary | ICD-10-CM

## 2018-11-03 DIAGNOSIS — I5032 Chronic diastolic (congestive) heart failure: Secondary | ICD-10-CM

## 2018-11-03 DIAGNOSIS — J9621 Acute and chronic respiratory failure with hypoxia: Secondary | ICD-10-CM | POA: Diagnosis not present

## 2018-11-03 DIAGNOSIS — G894 Chronic pain syndrome: Secondary | ICD-10-CM

## 2018-11-03 DIAGNOSIS — J181 Lobar pneumonia, unspecified organism: Secondary | ICD-10-CM

## 2018-11-03 DIAGNOSIS — N179 Acute kidney failure, unspecified: Secondary | ICD-10-CM

## 2018-11-03 DIAGNOSIS — J449 Chronic obstructive pulmonary disease, unspecified: Secondary | ICD-10-CM

## 2018-11-03 NOTE — Progress Notes (Signed)
[place] NOTE  PULMONARY SERVICE ROUNDS  Date of Service: 11/03/2018  Joy Yoder  DOB: [birth year]  Referring physician: Deanne Coffer, MD  HPI: Joy Yoder is a 53 y.o. female  being seen for Acute on Chronic Respiratory Failure.  Patient is capping right now on room air seems to be doing well  Review of Systems: Unremarkable other than noted in HPI  Allergies:  Reviewed on the [facility]  Medications: Reviewed  Vitals: Temperature 96.6 pulse 79 respiratory 20 blood pressure 130/50 saturations 97%  Ventilator Settings: Capping off the ventilator  Physical Exam: . General:  calm and comfortable NAD . Eyes: normal lids, irises & conjunctiva . ENT: grossly normal tongue not enlarged . Neck: no masses . Cardiovascular: S1 S2 Normal no rubs no gallop . Respiratory: No rhonchi or rales are noted at this time . Abdomen: soft non-distended . Skin: no rash seen on limited exam . Musculoskeletal:  no rigidity . Psychiatric: unable to assess . Neurologic: no involuntary movements          Lab Data and radiological Data:  No labs to report   Assessment/Plan  Patient Active Problem List   Diagnosis Date Noted  . Acute on chronic respiratory failure with hypoxia ([place])   . Chronic diastolic heart failure ([place])   . Chronic kidney disease, stage III (moderate) (HCC)   . COPD, severe ([place])   . Lobar pneumonia ([place])   . AKI (acute kidney injury) ([place]) 07/07/2017  . Chronic pain syndrome 07/07/2017  . Intractable nausea and vomiting 07/06/2017  . Intractable pain 07/04/2017  . Hypertension 07/04/2017  . Type 2 diabetes mellitus without complication ([place]) 89/38/1017      1. Acute on chronic respiratory failure with hypoxia we will continue with capping for now.  In reviewing the notes from the transferring facility it appears that the tracheoplasty with stenting was in consideration.  Patient however has been doing quite well and I am not  sure if will be required.  Will need to discuss it further with the ENT surgeons that were involved in the placement of the tracheostomy. 2. Chronic diastolic heart failure patient is compensated at this time 3. Chronic kidney disease stage III labs improving we will continue to follow 4. Severe COPD at baseline 5. Lobar pneumonia treated and resolved continue present management. 6. Chronic pain syndrome continue with baseline management   I have personally evaluated the patient, evaluated the laboratory and imaging results and formulated the assessment and plan and placed orders as needed. The Patient requires high complexity decision making for assessment and support. I have discussed the patient on rounds with the Respiratory Staff   Allyne Gee, MD [facility] Pulmonary Critical Care Medicine

## 2018-11-04 ENCOUNTER — Other Ambulatory Visit (HOSPITAL_COMMUNITY): Payer: 59 | Admitting: Internal Medicine

## 2018-11-04 DIAGNOSIS — N179 Acute kidney failure, unspecified: Secondary | ICD-10-CM

## 2018-11-04 DIAGNOSIS — N183 Chronic kidney disease, stage 3 unspecified: Secondary | ICD-10-CM

## 2018-11-04 DIAGNOSIS — J9621 Acute and chronic respiratory failure with hypoxia: Secondary | ICD-10-CM | POA: Diagnosis not present

## 2018-11-04 DIAGNOSIS — G894 Chronic pain syndrome: Secondary | ICD-10-CM

## 2018-11-04 DIAGNOSIS — I5032 Chronic diastolic (congestive) heart failure: Secondary | ICD-10-CM | POA: Diagnosis not present

## 2018-11-04 DIAGNOSIS — J449 Chronic obstructive pulmonary disease, unspecified: Secondary | ICD-10-CM

## 2018-11-04 NOTE — Progress Notes (Signed)
[place] NOTE  PULMONARY SERVICE ROUNDS  Date of Service: 11/04/2018  Joy Yoder  DOB: October 25, 1965  Referring physician: Deanne Coffer, MD  HPI: Joy Yoder is a 53 y.o. female  being seen for Acute on Chronic Respiratory Failure.  Spoke with the patient based on the notes that I could review from her previous admission it appears that the tracheostomy will remain in place until she can be seen possibly as an outpatient by pulmonary to see if she can be decannulated.  Review of Systems: Unremarkable other than noted in HPI  Allergies:  Reviewed on the [facility]  Medications: Reviewed  Vitals: Temperature 97.9 pulse 92 respiratory 16 blood pressure 118/60 saturations 99%  Ventilator Settings: Capping off the ventilator  Physical Exam: . General:  calm and comfortable NAD . Eyes: normal lids, irises & conjunctiva . ENT: grossly normal tongue not enlarged . Neck: no masses . Cardiovascular: S1 S2 Normal no rubs no gallop . Respiratory: No rhonchi or rales are noted . Abdomen: soft non-distended . Skin: no rash seen on limited exam . Musculoskeletal:  no rigidity . Psychiatric: unable to assess . Neurologic: no involuntary movements          Lab Data and radiological Data:  No labs to report today   Assessment/Plan  Patient Active Problem List   Diagnosis Date Noted  . Acute on chronic respiratory failure with hypoxia ([place])   . Chronic diastolic heart failure ([place])   . Chronic kidney disease, stage III (moderate) (HCC)   . COPD, severe ([place])   . Lobar pneumonia ([place])   . AKI (acute kidney injury) ([place]) 07/07/2017  . Chronic pain syndrome 07/07/2017  . Intractable nausea and vomiting 07/06/2017  . Intractable pain 07/04/2017  . Hypertension 07/04/2017  . Type 2 diabetes mellitus without complication ([place]) 35/00/9381      1. Acute on chronic respiratory failure with hypoxia we will continue with capping trials titrate oxygen  as necessary 2. Chronic diastolic heart failure at baseline diuretics as necessary 3. Chronic kidney disease stage III we will follow the labs 4. Lobar pneumonia treated we will continue with supportive care 5. Chronic pain syndrome unchanged we will continue present management 6. Severe COPD at baseline   I have personally evaluated the patient, evaluated the laboratory and imaging results and formulated the assessment and plan and placed orders as needed. The Patient requires high complexity decision making for assessment and support. I have discussed the patient on rounds with the Respiratory Staff   Allyne Gee, MD [facility] Pulmonary Critical Care Medicine

## 2018-11-05 ENCOUNTER — Other Ambulatory Visit (HOSPITAL_COMMUNITY): Payer: 59 | Admitting: Internal Medicine

## 2018-11-05 DIAGNOSIS — J449 Chronic obstructive pulmonary disease, unspecified: Secondary | ICD-10-CM

## 2018-11-05 DIAGNOSIS — N183 Chronic kidney disease, stage 3 unspecified: Secondary | ICD-10-CM

## 2018-11-05 DIAGNOSIS — N179 Acute kidney failure, unspecified: Secondary | ICD-10-CM

## 2018-11-05 DIAGNOSIS — J9621 Acute and chronic respiratory failure with hypoxia: Secondary | ICD-10-CM | POA: Diagnosis not present

## 2018-11-05 DIAGNOSIS — I5032 Chronic diastolic (congestive) heart failure: Secondary | ICD-10-CM | POA: Diagnosis not present

## 2018-11-05 DIAGNOSIS — G894 Chronic pain syndrome: Secondary | ICD-10-CM

## 2018-11-05 NOTE — Progress Notes (Signed)
[place] NOTE  PULMONARY SERVICE ROUNDS  Date of Service: 11/05/2018  Jamica Woodyard  DOB: 1965-10-19  Referring physician: Deanne Coffer, MD  HPI: Joy Yoder is a 53 y.o. female  being seen for Acute on Chronic Respiratory Failure.  She is capping right now on room air has been on 2 L  Review of Systems: Unremarkable other than noted in HPI  Allergies:  Reviewed on the [facility]  Medications: Reviewed  Vitals: Temperature 97.2 pulse 76 respiratory 12 blood pressure 144/70 saturations 99%  Ventilator Settings: Capping of the ventilator  Physical Exam: . General:  calm and comfortable NAD . Eyes: normal lids, irises & conjunctiva . ENT: grossly normal tongue not enlarged . Neck: no masses . Cardiovascular: S1 S2 Normal no rubs no gallop . Respiratory: No rhonchi or rales are noted at this time . Abdomen: soft non-distended . Skin: no rash seen on limited exam . Musculoskeletal:  no rigidity . Psychiatric: unable to assess . Neurologic: no involuntary movements          Lab Data and radiological Data:  Sodium 138 potassium 5.0 BUN 35 creatinine 1.8   Assessment/Plan  Patient Active Problem List   Diagnosis Date Noted  . Acute on chronic respiratory failure with hypoxia ([place])   . Chronic diastolic heart failure ([place])   . Chronic kidney disease, stage III (moderate) (HCC)   . COPD, severe ([place])   . Lobar pneumonia ([place])   . AKI (acute kidney injury) ([place]) 07/07/2017  . Chronic pain syndrome 07/07/2017  . Intractable nausea and vomiting 07/06/2017  . Intractable pain 07/04/2017  . Hypertension 07/04/2017  . Type 2 diabetes mellitus without complication ([place]) 74/25/9563      1. Acute on chronic respiratory failure with hypoxia we will continue with capping trials no decannulation planned at this time need to follow-up with her primary pulmonologist. 2. Chronic diastolic heart failure at baseline 3. Chronic kidney disease stage  III we will continue with supportive care 4. Severe COPD at baseline we will follow 5. Lobar pneumonia treated clinically improving   I have personally evaluated the patient, evaluated the laboratory and imaging results and formulated the assessment and plan and placed orders as needed. The Patient requires high complexity decision making for assessment and support. I have discussed the patient on rounds with the Respiratory Staff   Allyne Gee, MD [facility] Pulmonary Critical Care Medicine

## 2018-11-06 ENCOUNTER — Other Ambulatory Visit (HOSPITAL_COMMUNITY): Payer: 59 | Admitting: Internal Medicine

## 2018-11-06 DIAGNOSIS — J449 Chronic obstructive pulmonary disease, unspecified: Secondary | ICD-10-CM

## 2018-11-06 DIAGNOSIS — N179 Acute kidney failure, unspecified: Secondary | ICD-10-CM

## 2018-11-06 DIAGNOSIS — N183 Chronic kidney disease, stage 3 unspecified: Secondary | ICD-10-CM

## 2018-11-06 DIAGNOSIS — J9621 Acute and chronic respiratory failure with hypoxia: Secondary | ICD-10-CM

## 2018-11-06 DIAGNOSIS — I5032 Chronic diastolic (congestive) heart failure: Secondary | ICD-10-CM

## 2018-11-06 DIAGNOSIS — G894 Chronic pain syndrome: Secondary | ICD-10-CM

## 2018-11-06 DIAGNOSIS — J181 Lobar pneumonia, unspecified organism: Secondary | ICD-10-CM

## 2018-11-06 NOTE — Progress Notes (Signed)
[place]  Date of Service: 11/06/2018  Joy Yoder  DOB: 04-09-1966  Referring physician: Deanne Coffer, MD  HPI: Joy Yoder is a 53 y.o. female  being seen for Acute on Chronic Respiratory Failure.  She is comfortable without distress at this time capping no issues noted overnight  Review of Systems: Unremarkable other than noted in HPI  Allergies:  Reviewed on the [facility]  Medications: Reviewed  Vitals: Temperature 97.3 pulse 72 respiratory 22 blood pressure 130/60 saturations 99%  Ventilator Settings: On room air  Physical Exam: . General:  calm and comfortable NAD . Eyes: normal lids, irises & conjunctiva . ENT: grossly normal tongue not enlarged . Neck: no masses . Cardiovascular: S1 S2 Normal no rubs no gallop . Respiratory: No rhonchi no rales . Abdomen: soft non-distended . Skin: no rash seen on limited exam . Musculoskeletal:  no rigidity . Psychiatric: unable to assess . Neurologic: no involuntary movements          Lab Data and radiological Data:  Data reviewed   Assessment/Plan  Patient Active Problem List   Diagnosis Date Noted  . Acute on chronic respiratory failure with hypoxia ([place])   . Chronic diastolic heart failure ([place])   . Chronic kidney disease, stage III (moderate) (HCC)   . COPD, severe ([place])   . Lobar pneumonia ([place])   . AKI (acute kidney injury) ([place]) 07/07/2017  . Chronic pain syndrome 07/07/2017  . Intractable nausea and vomiting 07/06/2017  . Intractable pain 07/04/2017  . Hypertension 07/04/2017  . Type 2 diabetes mellitus without complication ([place]) 94/50/3888      1. Acute on chronic respiratory failure with hypoxia we will continue with capping patient not to be decannulated at this time. 2. Chronic diastolic heart failure at baseline we will continue with supportive care. 3. Chronic kidney disease stage III labs have been stable 4. Severe COPD at  baseline 5. Lobar pneumonia treated improved 6. Chronic pain syndrome treated we will continue to monitor   I have personally evaluated the patient, evaluated the laboratory and imaging results and formulated the assessment and plan and placed orders as needed. The Patient requires high complexity decision making for assessment and support. I have discussed the patient on rounds with the Respiratory Staff   Allyne Gee, MD [facility] Pulmonary Critical Care Medicine

## 2018-11-07 ENCOUNTER — Other Ambulatory Visit (HOSPITAL_COMMUNITY): Payer: 59 | Admitting: Internal Medicine

## 2018-11-07 DIAGNOSIS — J9621 Acute and chronic respiratory failure with hypoxia: Secondary | ICD-10-CM | POA: Diagnosis not present

## 2018-11-07 DIAGNOSIS — I5032 Chronic diastolic (congestive) heart failure: Secondary | ICD-10-CM

## 2018-11-07 DIAGNOSIS — N183 Chronic kidney disease, stage 3 unspecified: Secondary | ICD-10-CM

## 2018-11-07 DIAGNOSIS — N179 Acute kidney failure, unspecified: Secondary | ICD-10-CM

## 2018-11-07 DIAGNOSIS — J449 Chronic obstructive pulmonary disease, unspecified: Secondary | ICD-10-CM

## 2018-11-07 DIAGNOSIS — G894 Chronic pain syndrome: Secondary | ICD-10-CM

## 2018-11-07 DIAGNOSIS — J181 Lobar pneumonia, unspecified organism: Secondary | ICD-10-CM

## 2018-11-07 NOTE — Progress Notes (Signed)
[place] NOTE  PULMONARY SERVICE ROUNDS  Date of Service: 11/07/2018  Quentina Fronek  DOB: 1966-01-10  Referring physician: Deanne Coffer, MD  HPI: Chitara Clonch is a 53 y.o. female  being seen for Acute on Chronic Respiratory Failure.  Patient is capping right now on 3 L she is comfortable without distress  Review of Systems: Unremarkable other than noted in HPI  Allergies:  Reviewed on the [facility]  Medications: Reviewed  Vitals: Temperature 97.1 pulse 76 respiratory 23 blood pressure 130/60 saturations 99%  Ventilator Settings: Capping off the ventilator  Physical Exam: . General:  calm and comfortable NAD . Eyes: normal lids, irises & conjunctiva . ENT: grossly normal tongue not enlarged . Neck: no masses . Cardiovascular: S1 S2 Normal no rubs no gallop . Respiratory: No rhonchi or rales are noted at this time . Abdomen: soft non-distended . Skin: no rash seen on limited exam . Musculoskeletal:  no rigidity . Psychiatric: unable to assess . Neurologic: no involuntary movements          Lab Data and radiological Data:  No labs to report today   Assessment/Plan  Patient Active Problem List   Diagnosis Date Noted  . Acute on chronic respiratory failure with hypoxia ([place])   . Chronic diastolic heart failure ([place])   . Chronic kidney disease, stage III (moderate) (HCC)   . COPD, severe ([place])   . Lobar pneumonia ([place])   . AKI (acute kidney injury) ([place]) 07/07/2017  . Chronic pain syndrome 07/07/2017  . Intractable nausea and vomiting 07/06/2017  . Intractable pain 07/04/2017  . Hypertension 07/04/2017  . Type 2 diabetes mellitus without complication ([place]) 92/92/4462      1. Acute on chronic respiratory failure with hypoxia we will continue with capping trials as ordered continue secretion management pulmonary toilet. 2. Chronic diastolic heart failure baseline continue with present management 3. Kidney disease stage III labs  improving we will monitor 4. Severe COPD at baseline 5. Lobar pneumonia treated 6. Chronic pain syndrome at baseline continue present management   I have personally evaluated the patient, evaluated the laboratory and imaging results and formulated the assessment and plan and placed orders as needed. The Patient requires high complexity decision making for assessment and support. I have discussed the patient on rounds with the Respiratory Staff   Allyne Gee, MD [facility] Pulmonary Critical Care Medicine

## 2018-11-08 ENCOUNTER — Other Ambulatory Visit (HOSPITAL_COMMUNITY): Payer: 59 | Admitting: Internal Medicine

## 2018-11-08 DIAGNOSIS — J9621 Acute and chronic respiratory failure with hypoxia: Secondary | ICD-10-CM

## 2018-11-08 DIAGNOSIS — N179 Acute kidney failure, unspecified: Secondary | ICD-10-CM

## 2018-11-08 DIAGNOSIS — I5032 Chronic diastolic (congestive) heart failure: Secondary | ICD-10-CM | POA: Diagnosis not present

## 2018-11-08 DIAGNOSIS — G894 Chronic pain syndrome: Secondary | ICD-10-CM

## 2018-11-08 DIAGNOSIS — N183 Chronic kidney disease, stage 3 unspecified: Secondary | ICD-10-CM

## 2018-11-08 DIAGNOSIS — J449 Chronic obstructive pulmonary disease, unspecified: Secondary | ICD-10-CM

## 2018-11-08 NOTE — Progress Notes (Signed)
[place] NOTE  PULMONARY SERVICE ROUNDS  Date of Service: 11/08/2018  Tija Biss  DOB: 10-29-1965  Referring physician: Deanne Coffer, MD  HPI: Joy Yoder is a 53 y.o. female  being seen for Acute on Chronic Respiratory Failure.  She is doing well should be discharged in the next day family has been trained for trach care  Review of Systems: Unremarkable other than noted in HPI  Allergies:  Reviewed on the [facility]  Medications: Reviewed  Vitals: Temperature 96.3 pulse 77 respiratory rate 18 blood pressure 100/70 saturations 100%  Ventilator Settings: Capping on 3 L  Physical Exam: . General:  calm and comfortable NAD . Eyes: normal lids, irises & conjunctiva . ENT: grossly normal tongue not enlarged . Neck: no masses . Cardiovascular: S1 S2 Normal no rubs no gallop . Respiratory: No rhonchi or rales are noted at this time . Abdomen: soft non-distended . Skin: no rash seen on limited exam . Musculoskeletal:  no rigidity . Psychiatric: unable to assess . Neurologic: no involuntary movements          Lab Data and radiological Data:  Sodium 137 potassium 5.7 BUN 34 creatinine 1.4   Assessment/Plan  Patient Active Problem List   Diagnosis Date Noted  . Acute on chronic respiratory failure with hypoxia ([place])   . Chronic diastolic heart failure ([place])   . Chronic kidney disease, stage III (moderate) (HCC)   . COPD, severe ([place])   . Lobar pneumonia ([place])   . AKI (acute kidney injury) ([place]) 07/07/2017  . Chronic pain syndrome 07/07/2017  . Intractable nausea and vomiting 07/06/2017  . Intractable pain 07/04/2017  . Hypertension 07/04/2017  . Type 2 diabetes mellitus without complication ([place]) 28/00/3491      1. Acute on chronic respiratory failure with hypoxia we will continue with capping trials await discharge planning 2. Chronic diastolic heart failure at baseline 3. Chronic kidney disease stage III resolved 4. Severe  COPD continue with supportive care 5. Lobar pneumonia treated continue supportive care 6. Chronic pain syndrome pain management   I have personally evaluated the patient, evaluated the laboratory and imaging results and formulated the assessment and plan and placed orders as needed. The Patient requires high complexity decision making for assessment and support. I have discussed the patient on rounds with the Respiratory Staff   Allyne Gee, MD [facility] Pulmonary Critical Care Medicine

## 2018-11-09 ENCOUNTER — Other Ambulatory Visit (HOSPITAL_COMMUNITY): Payer: 59 | Admitting: Internal Medicine

## 2018-11-09 DIAGNOSIS — I5032 Chronic diastolic (congestive) heart failure: Secondary | ICD-10-CM

## 2018-11-09 DIAGNOSIS — N183 Chronic kidney disease, stage 3 unspecified: Secondary | ICD-10-CM

## 2018-11-09 DIAGNOSIS — J181 Lobar pneumonia, unspecified organism: Secondary | ICD-10-CM

## 2018-11-09 DIAGNOSIS — J9621 Acute and chronic respiratory failure with hypoxia: Secondary | ICD-10-CM | POA: Diagnosis not present

## 2018-11-09 DIAGNOSIS — N179 Acute kidney failure, unspecified: Secondary | ICD-10-CM

## 2018-11-09 DIAGNOSIS — G894 Chronic pain syndrome: Secondary | ICD-10-CM

## 2018-11-09 DIAGNOSIS — J449 Chronic obstructive pulmonary disease, unspecified: Secondary | ICD-10-CM

## 2018-11-09 NOTE — Progress Notes (Signed)
Joy Yoder  PULMONARY SERVICE ROUNDS  Date of Service: 11/09/2018  Joy Yoder  DOB: September 08, 1965  Referring physician: Deanne Coffer, MD  HPI: Joy Yoder is a 53 y.o. female  being seen for Acute on Chronic Respiratory Failure.  Patient is capping on room air possible discharge today  Review of Systems: Unremarkable other than noted in HPI  Allergies:  Reviewed on the [facility]  Medications: Reviewed  Vitals: Temperature 97.0 pulse 80 respiratory rate 18 blood pressure 132/70 saturation 96%  Ventilator Settings: Off the ventilator capping  Physical Exam: . General:  calm and comfortable NAD . Eyes: normal lids, irises & conjunctiva . ENT: grossly normal tongue not enlarged . Neck: no masses . Cardiovascular: S1 S2 Normal no rubs no gallop . Respiratory: No rhonchi or rales are noted . Abdomen: soft non-distended . Skin: no rash seen on limited exam . Musculoskeletal:  no rigidity . Psychiatric: unable to assess . Neurologic: no involuntary movements          Lab Data and radiological Data:  No labs to report   Assessment/Plan  Patient Active Problem List   Diagnosis Date Noted  . Acute on chronic respiratory failure with hypoxia ([place])   . Chronic diastolic heart failure ([place])   . Chronic kidney disease, stage III (moderate) (HCC)   . COPD, severe ([place])   . Lobar pneumonia ([place])   . AKI (acute kidney injury) ([place]) 07/07/2017  . Chronic pain syndrome 07/07/2017  . Intractable nausea and vomiting 07/06/2017  . Intractable pain 07/04/2017  . Hypertension 07/04/2017  . Type 2 diabetes mellitus without complication ([place]) 06/03/2810      1. Acute on chronic respiratory failure hypoxia we will continue with capping trials as ordered continue secretion management pulmonary toilet. 2. Chronic diastolic heart failure at baseline 3. Chronic kidney disease improved 4. Severe COPD at baseline 5. Lobar pneumonia  resolved 6. Chronic pain syndrome on pain management   I have personally evaluated the patient, evaluated the laboratory and imaging results and formulated the assessment and plan and placed orders as needed. The Patient requires high complexity decision making for assessment and support. I have discussed the patient on rounds with the Respiratory Staff   Allyne Gee, MD [facility] Pulmonary Critical Care Medicine

## 2019-11-09 ENCOUNTER — Emergency Department (HOSPITAL_COMMUNITY): Payer: 59

## 2019-11-09 ENCOUNTER — Other Ambulatory Visit: Payer: Self-pay

## 2019-11-09 ENCOUNTER — Encounter (HOSPITAL_COMMUNITY): Payer: Self-pay

## 2019-11-09 ENCOUNTER — Observation Stay (HOSPITAL_COMMUNITY)
Admission: EM | Admit: 2019-11-09 | Discharge: 2019-11-11 | Disposition: A | Discharge status: Home / Self Care | Payer: 59 | Attending: Internal Medicine | Admitting: Internal Medicine

## 2019-11-09 DIAGNOSIS — I130 Hypertensive heart and chronic kidney disease with heart failure and stage 1 through stage 4 chronic kidney disease, or unspecified chronic kidney disease: Secondary | ICD-10-CM | POA: Insufficient documentation

## 2019-11-09 DIAGNOSIS — Z23 Encounter for immunization: Secondary | ICD-10-CM | POA: Insufficient documentation

## 2019-11-09 DIAGNOSIS — N183 Chronic kidney disease, stage 3 unspecified: Secondary | ICD-10-CM | POA: Diagnosis not present

## 2019-11-09 DIAGNOSIS — Z20822 Contact with and (suspected) exposure to covid-19: Secondary | ICD-10-CM | POA: Insufficient documentation

## 2019-11-09 DIAGNOSIS — D759 Disease of blood and blood-forming organs, unspecified: Secondary | ICD-10-CM | POA: Diagnosis not present

## 2019-11-09 DIAGNOSIS — I5032 Chronic diastolic (congestive) heart failure: Secondary | ICD-10-CM | POA: Diagnosis not present

## 2019-11-09 DIAGNOSIS — F419 Anxiety disorder, unspecified: Secondary | ICD-10-CM | POA: Diagnosis not present

## 2019-11-09 DIAGNOSIS — J9611 Chronic respiratory failure with hypoxia: Secondary | ICD-10-CM | POA: Insufficient documentation

## 2019-11-09 DIAGNOSIS — J449 Chronic obstructive pulmonary disease, unspecified: Secondary | ICD-10-CM | POA: Diagnosis not present

## 2019-11-09 DIAGNOSIS — E1165 Type 2 diabetes mellitus with hyperglycemia: Secondary | ICD-10-CM | POA: Insufficient documentation

## 2019-11-09 DIAGNOSIS — R339 Retention of urine, unspecified: Secondary | ICD-10-CM | POA: Diagnosis not present

## 2019-11-09 DIAGNOSIS — E1122 Type 2 diabetes mellitus with diabetic chronic kidney disease: Secondary | ICD-10-CM | POA: Diagnosis not present

## 2019-11-09 DIAGNOSIS — Z86718 Personal history of other venous thrombosis and embolism: Secondary | ICD-10-CM | POA: Insufficient documentation

## 2019-11-09 DIAGNOSIS — Z7902 Long term (current) use of antithrombotics/antiplatelets: Secondary | ICD-10-CM | POA: Insufficient documentation

## 2019-11-09 DIAGNOSIS — Z794 Long term (current) use of insulin: Secondary | ICD-10-CM | POA: Diagnosis not present

## 2019-11-09 DIAGNOSIS — Z7901 Long term (current) use of anticoagulants: Secondary | ICD-10-CM | POA: Diagnosis not present

## 2019-11-09 DIAGNOSIS — Z8673 Personal history of transient ischemic attack (TIA), and cerebral infarction without residual deficits: Secondary | ICD-10-CM | POA: Insufficient documentation

## 2019-11-09 DIAGNOSIS — D649 Anemia, unspecified: Secondary | ICD-10-CM | POA: Diagnosis not present

## 2019-11-09 DIAGNOSIS — E785 Hyperlipidemia, unspecified: Secondary | ICD-10-CM | POA: Diagnosis not present

## 2019-11-09 DIAGNOSIS — Z79899 Other long term (current) drug therapy: Secondary | ICD-10-CM | POA: Diagnosis not present

## 2019-11-09 DIAGNOSIS — G894 Chronic pain syndrome: Secondary | ICD-10-CM | POA: Insufficient documentation

## 2019-11-09 DIAGNOSIS — Z9981 Dependence on supplemental oxygen: Secondary | ICD-10-CM | POA: Insufficient documentation

## 2019-11-09 DIAGNOSIS — R0789 Other chest pain: Secondary | ICD-10-CM | POA: Insufficient documentation

## 2019-11-09 DIAGNOSIS — R079 Chest pain, unspecified: Secondary | ICD-10-CM

## 2019-11-09 LAB — TROPONIN I (HIGH SENSITIVITY): Troponin I (High Sensitivity): 8 ng/L (ref <18)

## 2019-11-09 LAB — CBC WITH DIFFERENTIAL/PLATELET
Abs Immature Granulocytes: 0.03 10*3/uL (ref 0.00–0.07)
Basophils Absolute: 0 10*3/uL (ref 0.0–0.1)
Basophils Relative: 0 %
Eosinophils Absolute: 0.1 10*3/uL (ref 0.0–0.5)
Eosinophils Relative: 2 %
HCT: 21.4 % — ABNORMAL LOW (ref 36.0–46.0)
Hemoglobin: 6.2 g/dL — CL (ref 12.0–15.0)
Immature Granulocytes: 0 %
Lymphocytes Relative: 27 %
Lymphs Abs: 2 10*3/uL (ref 0.7–4.0)
MCH: 25 pg — ABNORMAL LOW (ref 26.0–34.0)
MCHC: 29 g/dL — ABNORMAL LOW (ref 30.0–36.0)
MCV: 86.3 fL (ref 80.0–100.0)
Monocytes Absolute: 0.6 10*3/uL (ref 0.1–1.0)
Monocytes Relative: 9 %
Neutro Abs: 4.5 10*3/uL (ref 1.7–7.7)
Neutrophils Relative %: 62 %
Platelets: 189 10*3/uL (ref 150–400)
RBC: 2.48 MIL/uL — ABNORMAL LOW (ref 3.87–5.11)
RDW: 16.5 % — ABNORMAL HIGH (ref 11.5–15.5)
WBC: 7.2 10*3/uL (ref 4.0–10.5)
nRBC: 0.3 % — ABNORMAL HIGH (ref 0.0–0.2)

## 2019-11-09 LAB — BASIC METABOLIC PANEL
Anion gap: 5 (ref 5–15)
BUN: 32 mg/dL — ABNORMAL HIGH (ref 6–20)
CO2: 27 mmol/L (ref 22–32)
Calcium: 8.4 mg/dL — ABNORMAL LOW (ref 8.9–10.3)
Chloride: 102 mmol/L (ref 98–111)
Creatinine, Ser: 2.18 mg/dL — ABNORMAL HIGH (ref 0.44–1.00)
GFR calc Af Amer: 29 mL/min — ABNORMAL LOW (ref >60)
GFR calc non Af Amer: 25 mL/min — ABNORMAL LOW (ref >60)
Glucose, Bld: 152 mg/dL — ABNORMAL HIGH (ref 70–99)
Potassium: 4.4 mmol/L (ref 3.5–5.1)
Sodium: 134 mmol/L — ABNORMAL LOW (ref 135–145)

## 2019-11-09 LAB — BRAIN NATRIURETIC PEPTIDE: B Natriuretic Peptide: 136 pg/mL — ABNORMAL HIGH (ref 0.0–100.0)

## 2019-11-09 LAB — PREPARE RBC (CROSSMATCH)

## 2019-11-09 LAB — POC OCCULT BLOOD, ED: Fecal Occult Bld: NEGATIVE

## 2019-11-09 MED ORDER — SODIUM CHLORIDE 0.9% IV SOLUTION: Freq: Once | INTRAVENOUS | Status: AC

## 2019-11-09 NOTE — ED Provider Notes (Signed)
[facility] EMERGENCY DEPARTMENT Provider Note   CSN: [id number] Arrival date & time: 11/09/19  2015     History Chief Complaint  Patient presents with  . Chest Pain    Joy Yoder is a 54 y.o. female.  HPI   Pt is a 54 y/o female with a h/o CHF, COPD (on chronic 3L O2), CKD, DM, HTN, CVA, who presents to the ED today for eval of chest pain that started several weeks ago.  Pain located to the bilat upper chest. Pain feels like a sharp pain. It is constant.  Pain is rated 10/10.  She also reports shortness of breath, pleuritic pain, and BLE swelling. Has had mild intermittent cough. Denies hemoptysis.  She was seen at [facility] last night for her sxs and she received morphine which helped temporarily.   States she has mold in her apartment and she thinks this is contributing to her sxs.   States she had a DVT last month. She is currently on eliquis. She has not missed any doses. States that at that time she was neg for PE.  States she saw her cardiologist today, Dr. Domingo Pulse who schedule her for an outpatient stress test.   Past Medical History:  Diagnosis Date  . Acute on chronic respiratory failure with hypoxia ([place])   . Anxiety   . CHF (congestive heart failure) ([place])   . Chronic diastolic heart failure ([place])   . Chronic kidney disease, stage III (moderate)   . COPD, severe ([place])   . DDD (degenerative disc disease)   . DDD (degenerative disc disease), thoracic    chest to back, legs,   . Diabetes mellitus   . DJD (degenerative joint disease)   . Hypertension   . Kidney disease    stage 3   . Lobar pneumonia ([place])   . Neuropathy   . Stroke [facility])     Patient Active Problem List   Diagnosis Date Noted  . Acute on chronic respiratory failure with hypoxia ([place])   . Chronic diastolic heart failure ([place])   . Chronic kidney disease, stage III (moderate)   . COPD, severe ([place])   . Lobar pneumonia ([place])   . AKI (acute kidney injury) ([place]) 07/07/2017  .  Chronic pain syndrome 07/07/2017  . Intractable nausea and vomiting 07/06/2017  . Intractable pain 07/04/2017  . Hypertension 07/04/2017  . Type 2 diabetes mellitus without complication ([place]) 84/13/2440    Past Surgical History:  Procedure Laterality Date  . ABDOMINAL HYSTERECTOMY    . bladder tack    . CHOLECYSTECTOMY    . EYE SURGERY    . THYROID SURGERY       OB History   No obstetric history on file.     No family history on file.  Social History   Tobacco Use  . Smoking status: Never Smoker  Substance Use Topics  . Alcohol use: Never  . Drug use: No    Home Medications Prior to Admission medications   Medication Sig Start Date End Date Taking? Authorizing Provider  amitriptyline (ELAVIL) 25 MG tablet Take 50 mg by mouth at bedtime.   Yes [provider]  amLODipine (NORVASC) 5 MG tablet Take 5 mg by mouth daily.    Yes [provider]  atorvastatin (LIPITOR) 80 MG tablet Take 80 mg by mouth daily at 6 PM.   Yes [provider]  carvedilol (COREG) 25 MG tablet Take 25 mg by mouth 2 (two) times daily with  a meal.    Yes [provider]  clopidogrel (PLAVIX) 75 MG tablet Take 75 mg by mouth daily.   Yes [provider]  cyclobenzaprine (FLEXERIL) 10 MG tablet Take 10 mg by mouth 3 (three) times daily as needed for muscle spasms.   Yes [provider]  furosemide (LASIX) 20 MG tablet Take 20 mg by mouth in the morning.   Yes [provider]  gabapentin (NEURONTIN) 600 MG tablet Take 600 mg by mouth 3 (three) times daily.    Yes [provider]  hydrALAZINE (APRESOLINE) 100 MG tablet Take 100 mg by mouth 3 (three) times daily.    Yes [provider]  HYDROcodone-acetaminophen (NORCO/VICODIN) 5-325 MG tablet Take 1 tablet by mouth every 6 (six) hours as needed for moderate pain.   Yes [provider]  hydrOXYzine (ATARAX/VISTARIL) 50 MG tablet Take 50 mg by mouth in the morning and at  bedtime.    Yes [provider]  insulin lispro (HUMALOG) 100 UNIT/ML injection Inject 5-16 Units into the skin 3 (three) times daily before meals. Patient injects according to sliding scale at home.   Yes [provider]  isosorbide mononitrate (IMDUR) 30 MG 24 hr tablet Take 30 mg by mouth in the morning.    Yes [provider]  LANTUS SOLOSTAR 100 UNIT/ML Solostar Pen Inject 50 Units into the skin at bedtime.  10/19/19  Yes [provider]  pantoprazole (PROTONIX) 40 MG tablet Take 40 mg by mouth in the morning.   Yes [provider]  venlafaxine XR (EFFEXOR-XR) 150 MG 24 hr capsule Take 150 mg by mouth every evening.    Yes [provider]    Allergies    Metformin and related, Nsaids, Pravastatin, Reglan [metoclopramide], and Tramadol  Review of Systems   Review of Systems  Constitutional: Negative for chills and fever.  HENT: Negative for ear pain and sore throat.   Eyes: Negative for pain and visual disturbance.  Respiratory: Positive for cough and shortness of breath.   Cardiovascular: Positive for chest pain and leg swelling. Negative for palpitations.  Gastrointestinal: Negative for abdominal pain, constipation, diarrhea, nausea and vomiting.  Genitourinary: Negative for dysuria and hematuria.  Musculoskeletal: Negative for back pain.  Skin: Negative for rash.  Neurological: Negative for seizures and syncope.  All other systems reviewed and are negative.   Physical Exam Updated Vital Signs BP (!) 192/75   Pulse 80   Temp 98 F (36.7 C)   Resp 14   Ht 5\' 2"  (1.575 m)   Wt 83.5 kg   SpO2 97%   BMI 33.65 kg/m   Physical Exam Vitals and nursing note reviewed.  Constitutional:      General: She is not in acute distress.    Appearance: She is well-developed.  HENT:     Head: Normocephalic and atraumatic.  Eyes:     Conjunctiva/sclera: Conjunctivae normal.  Cardiovascular:     Rate and Rhythm: Normal rate and  regular rhythm.     Heart sounds: Normal heart sounds. No murmur.  Pulmonary:     Effort: Pulmonary effort is normal. No respiratory distress.     Breath sounds: Decreased breath sounds and wheezing present.  Abdominal:     Palpations: Abdomen is soft.     Tenderness: There is no abdominal tenderness.  Musculoskeletal:     Cervical back: Neck supple.     Right lower leg: No tenderness.     Left lower leg: No  tenderness. Edema present.  Skin:    General: Skin is warm and dry.  Neurological:     Mental Status: She is alert.     ED Results / Procedures / Treatments   Labs (all labs ordered are listed, but only abnormal results are displayed) Labs Reviewed  CBC WITH DIFFERENTIAL/PLATELET - Abnormal; Notable for the following components:      Result Value   RBC 2.48 (*)    Hemoglobin 6.2 (*)    HCT 21.4 (*)    MCH 25.0 (*)    MCHC 29.0 (*)    RDW 16.5 (*)    nRBC 0.3 (*)    All other components within normal limits  BASIC METABOLIC PANEL - Abnormal; Notable for the following components:   Sodium 134 (*)    Glucose, Bld 152 (*)    BUN 32 (*)    Creatinine, Ser 2.18 (*)    Calcium 8.4 (*)    GFR calc non Af Amer 25 (*)    GFR calc Af Amer 29 (*)    All other components within normal limits  BRAIN NATRIURETIC PEPTIDE - Abnormal; Notable for the following components:   B Natriuretic Peptide 136.0 (*)    All other components within normal limits  SARS CORONAVIRUS 2 (TAT 6-24 HRS)  POC OCCULT BLOOD, ED  TYPE AND SCREEN  PREPARE RBC (CROSSMATCH)  ABO/RH  TROPONIN I (HIGH SENSITIVITY)  TROPONIN I (HIGH SENSITIVITY)    EKG EKG Interpretation  Date/Time:  Wednesday November 09 2019 21:00:13 EDT Ventricular Rate:  80 PR Interval:    QRS Duration: 80 QT Interval:  391 QTC Calculation: 451 R Axis:   36 Text Interpretation: Sinus rhythm Non-specific ST-t changes Confirmed by Lajean Saver [phone]) on 11/09/2019 9:07:55 PM   Radiology DG Chest Portable 1 View  Result Date:  11/09/2019 CLINICAL DATA:  Chest pain EXAM: PORTABLE CHEST 1 VIEW COMPARISON:  07/04/2017 FINDINGS: Tracheostomy tip projects over the mid trachea. Cardiomegaly with vascular congestion. Low volumes with bibasilar atelectasis. No effusions. No acute bony abnormality. IMPRESSION: Low volumes with bibasilar atelectasis. Cardiomegaly, vascular congestion. Electronically Signed   By: Rolm Baptise M.D.   On: 11/09/2019 22:02    Procedures Procedures (including critical care time) CRITICAL CARE Performed by: Rodney Booze   Total critical care time: 37 minutes  Critical care time was exclusive of separately billable procedures and treating other patients.  Critical care was necessary to treat or prevent imminent or life-threatening deterioration.  Critical care was time spent personally by me on the following activities: development of treatment plan with patient and/or surrogate as well as nursing, discussions with consultants, evaluation of patient's response to treatment, examination of patient, obtaining history from patient or surrogate, ordering and performing treatments and interventions, ordering and review of laboratory studies, ordering and review of radiographic studies, pulse oximetry and re-evaluation of patient's condition.   Medications Ordered in ED Medications  furosemide (LASIX) injection 20 mg (has no administration in time range)  0.9 %  sodium chloride infusion (Manually program via Guardrails IV Fluids) ( Intravenous New Bag/Given 11/10/19 0102)    ED Course  I have reviewed the triage vital signs and the nursing notes.  Pertinent labs & imaging results that were available during my care of the patient were reviewed by me and considered in my medical decision making (see chart for details).    MDM Rules/Calculators/A&P  54 y/o F presenting for eval of chest pain.   Reviewed/interpreted labs CBC was without leukocytosis, hemoglobin was notably  low at 6.2.  -Reviewed labs from prior ED visit at [facility] last night where hemoglobin was 7.2.  -Patient reports history of chronic anemia and gets iron infusions biweekly. She missed her most recent iron infusion due to feeling poorly.  -Hemoccult negative. Denies melena or hematochezia.  - 1 unit PRBC given BMP with elevated BUN/creatinine at 32/2.18  - Creatinine worse from labs last night (1.88) BNP marginally elevated Trop neg  EKG Sinus rhythm Non-specific ST-t changes   CXR  Low volumes with bibasilar atelectasis. Cardiomegaly, vascular congestion.  Will plan for admission for further tx of symptomatic anemia.   Discussed case with Dr. Ashok Cordia who is in agreement with the plan.   1:35 PM CONSULT with Dr. Chancy Milroy with hospitalist service who accepts patient for admission  Final Clinical Impression(s) / ED Diagnoses Final diagnoses:  None    Rx / DC Orders ED Discharge Orders    None       Rodney Booze, PA-C 11/10/19 [id number]    Lajean Saver, MD 11/10/19 [phone]

## 2019-11-09 NOTE — ED Triage Notes (Signed)
Pt to er, pt states that she has been having chest pain for the past few weeks, states that she has been to another hospital and they say it is because they took her off morphine, states that there is mold in her apartment and she thinks this is making her sick.  Pt states that it is a constant pain.

## 2019-11-10 ENCOUNTER — Encounter (HOSPITAL_COMMUNITY): Payer: Self-pay | Admitting: Internal Medicine

## 2019-11-10 DIAGNOSIS — E1165 Type 2 diabetes mellitus with hyperglycemia: Secondary | ICD-10-CM | POA: Diagnosis present

## 2019-11-10 DIAGNOSIS — R0789 Other chest pain: Secondary | ICD-10-CM | POA: Diagnosis present

## 2019-11-10 DIAGNOSIS — D649 Anemia, unspecified: Secondary | ICD-10-CM

## 2019-11-10 LAB — HEMOGLOBIN AND HEMATOCRIT, BLOOD
HCT: 30.3 % — ABNORMAL LOW (ref 36.0–46.0)
Hemoglobin: 9.1 g/dL — ABNORMAL LOW (ref 12.0–15.0)

## 2019-11-10 LAB — CBC
HCT: 26.5 % — ABNORMAL LOW (ref 36.0–46.0)
Hemoglobin: 7.9 g/dL — ABNORMAL LOW (ref 12.0–15.0)
MCH: 26.1 pg (ref 26.0–34.0)
MCHC: 29.8 g/dL — ABNORMAL LOW (ref 30.0–36.0)
MCV: 87.5 fL (ref 80.0–100.0)
Platelets: 189 10*3/uL (ref 150–400)
RBC: 3.03 MIL/uL — ABNORMAL LOW (ref 3.87–5.11)
RDW: 16.2 % — ABNORMAL HIGH (ref 11.5–15.5)
WBC: 8 10*3/uL (ref 4.0–10.5)
nRBC: 0 % (ref 0.0–0.2)

## 2019-11-10 LAB — GLUCOSE, CAPILLARY
Glucose-Capillary: 132 mg/dL — ABNORMAL HIGH (ref 70–99)
Glucose-Capillary: 130 mg/dL — ABNORMAL HIGH (ref 70–99)
Glucose-Capillary: 135 mg/dL — ABNORMAL HIGH (ref 70–99)
Glucose-Capillary: 122 mg/dL — ABNORMAL HIGH (ref 70–99)
Glucose-Capillary: 168 mg/dL — ABNORMAL HIGH (ref 70–99)

## 2019-11-10 LAB — RESPIRATORY PANEL BY RT PCR (FLU A&B, COVID)
Influenza A by PCR: NEGATIVE
Influenza B by PCR: NEGATIVE
SARS Coronavirus 2 by RT PCR: NEGATIVE

## 2019-11-10 LAB — COMPREHENSIVE METABOLIC PANEL
ALT: 18 U/L (ref 0–44)
AST: 16 U/L (ref 15–41)
Albumin: 3.7 g/dL (ref 3.5–5.0)
Alkaline Phosphatase: 85 U/L (ref 38–126)
Anion gap: 8 (ref 5–15)
BUN: 30 mg/dL — ABNORMAL HIGH (ref 6–20)
CO2: 27 mmol/L (ref 22–32)
Calcium: 9 mg/dL (ref 8.9–10.3)
Chloride: 104 mmol/L (ref 98–111)
Creatinine, Ser: 1.98 mg/dL — ABNORMAL HIGH (ref 0.44–1.00)
GFR calc Af Amer: 32 mL/min — ABNORMAL LOW (ref >60)
GFR calc non Af Amer: 28 mL/min — ABNORMAL LOW (ref >60)
Glucose, Bld: 134 mg/dL — ABNORMAL HIGH (ref 70–99)
Potassium: 4.7 mmol/L (ref 3.5–5.1)
Sodium: 139 mmol/L (ref 135–145)
Total Bilirubin: 0.5 mg/dL (ref 0.3–1.2)
Total Protein: 8.8 g/dL — ABNORMAL HIGH (ref 6.5–8.1)

## 2019-11-10 LAB — TROPONIN I (HIGH SENSITIVITY)
Troponin I (High Sensitivity): 8 ng/L (ref <18)
Troponin I (High Sensitivity): 8 ng/L (ref <18)
Troponin I (High Sensitivity): 8 ng/L (ref <18)

## 2019-11-10 LAB — MRSA PCR SCREENING: MRSA by PCR: POSITIVE — AB

## 2019-11-10 LAB — HEPARIN LEVEL (UNFRACTIONATED): Heparin Unfractionated: <0.1 IU/mL — ABNORMAL LOW (ref 0.30–0.70)

## 2019-11-10 LAB — SARS CORONAVIRUS 2 (TAT 6-24 HRS): SARS Coronavirus 2: NEGATIVE

## 2019-11-10 LAB — APTT: aPTT: 35 seconds (ref 24–36)

## 2019-11-10 LAB — HIV ANTIBODY (ROUTINE TESTING W REFLEX): HIV Screen 4th Generation wRfx: NONREACTIVE

## 2019-11-10 LAB — ABO/RH: ABO/RH(D): O POS

## 2019-11-10 LAB — PREPARE RBC (CROSSMATCH)

## 2019-11-10 MED ORDER — ACETAMINOPHEN 650 MG RE SUPP: 650.0000 mg | Freq: Four times a day (QID) | RECTAL | Status: DC | PRN

## 2019-11-10 MED ORDER — FUROSEMIDE 20 MG PO TABS
20.0000 mg | ORAL_TABLET | Freq: Every morning | ORAL | Status: DC
  Administered 2019-11-10 – 2019-11-11 (×2): 20 mg via ORAL
  Filled 2019-11-10 (×2): qty 1

## 2019-11-10 MED ORDER — AZITHROMYCIN 250 MG PO TABS
500.0000 mg | ORAL_TABLET | Freq: Every day | ORAL | Status: DC
  Administered 2019-11-10 – 2019-11-11 (×2): 500 mg via ORAL
  Filled 2019-11-10 (×2): qty 2

## 2019-11-10 MED ORDER — HYDROXYZINE HCL 25 MG PO TABS
50.0000 mg | ORAL_TABLET | Freq: Two times a day (BID) | ORAL | Status: DC
  Administered 2019-11-10 – 2019-11-11 (×3): 50 mg via ORAL
  Filled 2019-11-10 (×3): qty 2

## 2019-11-10 MED ORDER — PANTOPRAZOLE SODIUM 40 MG PO TBEC
40.0000 mg | DELAYED_RELEASE_TABLET | Freq: Every morning | ORAL | Status: DC
  Administered 2019-11-10 – 2019-11-11 (×2): 40 mg via ORAL
  Filled 2019-11-10 (×2): qty 1

## 2019-11-10 MED ORDER — HYDRALAZINE HCL 20 MG/ML IJ SOLN
10.0000 mg | INTRAMUSCULAR | Status: AC
  Administered 2019-11-10: 10 mg via INTRAVENOUS
  Filled 2019-11-10: qty 1

## 2019-11-10 MED ORDER — IPRATROPIUM-ALBUTEROL 0.5-2.5 (3) MG/3ML IN SOLN
3.0000 mL | Freq: Four times a day (QID) | RESPIRATORY_TRACT | Status: DC
  Administered 2019-11-10 – 2019-11-11 (×6): 3 mL via RESPIRATORY_TRACT
  Filled 2019-11-10 (×6): qty 3

## 2019-11-10 MED ORDER — MORPHINE SULFATE (PF) 2 MG/ML IV SOLN
2.0000 mg | Freq: Four times a day (QID) | INTRAVENOUS | Status: DC | PRN
  Administered 2019-11-10 (×2): 2 mg via INTRAVENOUS
  Filled 2019-11-10 (×2): qty 1

## 2019-11-10 MED ORDER — HEPARIN (PORCINE) 25000 UT/250ML-% IV SOLN
1000.0000 [IU]/h | INTRAVENOUS | Status: DC
  Administered 2019-11-10: 09:00:00 1000 [IU]/h via INTRAVENOUS
  Filled 2019-11-10: qty 250

## 2019-11-10 MED ORDER — INSULIN ASPART 100 UNIT/ML ~~LOC~~ SOLN: 0.0000 [IU] | Freq: Every day | SUBCUTANEOUS | Status: DC

## 2019-11-10 MED ORDER — IPRATROPIUM-ALBUTEROL 0.5-2.5 (3) MG/3ML IN SOLN: 3.0000 mL | RESPIRATORY_TRACT | Status: DC | PRN

## 2019-11-10 MED ORDER — HYDRALAZINE HCL 25 MG PO TABS
100.0000 mg | ORAL_TABLET | Freq: Three times a day (TID) | ORAL | Status: DC
  Administered 2019-11-10: 23:00:00 50 mg via ORAL
  Administered 2019-11-10 – 2019-11-11 (×4): 100 mg via ORAL
  Filled 2019-11-10 (×5): qty 4

## 2019-11-10 MED ORDER — CHLORHEXIDINE GLUCONATE CLOTH 2 % EX PADS
6.0000 | MEDICATED_PAD | Freq: Every day | CUTANEOUS | Status: DC
  Administered 2019-11-10: 6 via TOPICAL

## 2019-11-10 MED ORDER — ALBUTEROL SULFATE HFA 108 (90 BASE) MCG/ACT IN AERS
2.0000 | INHALATION_SPRAY | Freq: Once | RESPIRATORY_TRACT | Status: AC
  Administered 2019-11-10: 2 via RESPIRATORY_TRACT
  Filled 2019-11-10: qty 6.7

## 2019-11-10 MED ORDER — INSULIN ASPART 100 UNIT/ML ~~LOC~~ SOLN
0.0000 [IU] | Freq: Three times a day (TID) | SUBCUTANEOUS | Status: DC
  Administered 2019-11-10 (×3): 1 [IU] via SUBCUTANEOUS
  Administered 2019-11-11 (×2): 2 [IU] via SUBCUTANEOUS
  Administered 2019-11-11: 12:00:00 3 [IU] via SUBCUTANEOUS

## 2019-11-10 MED ORDER — AMLODIPINE BESYLATE 5 MG PO TABS
5.0000 mg | ORAL_TABLET | Freq: Every day | ORAL | Status: DC
  Administered 2019-11-10 – 2019-11-11 (×2): 5 mg via ORAL
  Filled 2019-11-10 (×2): qty 1

## 2019-11-10 MED ORDER — HEPARIN BOLUS VIA INFUSION
2000.0000 [IU] | Freq: Once | INTRAVENOUS | Status: AC
  Administered 2019-11-10: 09:00:00 2000 [IU] via INTRAVENOUS
  Filled 2019-11-10: qty 2000

## 2019-11-10 MED ORDER — MUPIROCIN 2 % EX OINT
1.0000 "application " | TOPICAL_OINTMENT | Freq: Two times a day (BID) | CUTANEOUS | Status: DC
  Administered 2019-11-10 – 2019-11-11 (×2): 1 via NASAL
  Filled 2019-11-10: qty 22

## 2019-11-10 MED ORDER — ACETAMINOPHEN 325 MG PO TABS: 650.0000 mg | ORAL_TABLET | Freq: Four times a day (QID) | ORAL | Status: DC | PRN

## 2019-11-10 MED ORDER — PNEUMOCOCCAL VAC POLYVALENT 25 MCG/0.5ML IJ INJ
0.5000 mL | INJECTION | INTRAMUSCULAR | Status: AC
  Administered 2019-11-11: 17:00:00 0.5 mL via INTRAMUSCULAR
  Filled 2019-11-10: qty 0.5

## 2019-11-10 MED ORDER — ONDANSETRON HCL 4 MG/2ML IJ SOLN: 4.0000 mg | Freq: Four times a day (QID) | INTRAMUSCULAR | Status: DC | PRN

## 2019-11-10 MED ORDER — SODIUM CHLORIDE 0.9% IV SOLUTION: Freq: Once | INTRAVENOUS | Status: AC

## 2019-11-10 MED ORDER — ISOSORBIDE MONONITRATE ER 30 MG PO TB24
30.0000 mg | ORAL_TABLET | Freq: Every morning | ORAL | Status: DC
  Administered 2019-11-10 – 2019-11-11 (×2): 30 mg via ORAL
  Filled 2019-11-10 (×2): qty 1

## 2019-11-10 MED ORDER — FUROSEMIDE 10 MG/ML IJ SOLN
20.0000 mg | Freq: Once | INTRAMUSCULAR | Status: AC
  Administered 2019-11-10: 02:00:00 20 mg via INTRAVENOUS
  Filled 2019-11-10: qty 2

## 2019-11-10 MED ORDER — GABAPENTIN 300 MG PO CAPS
600.0000 mg | ORAL_CAPSULE | Freq: Three times a day (TID) | ORAL | Status: DC
  Administered 2019-11-10 – 2019-11-11 (×5): 600 mg via ORAL
  Filled 2019-11-10 (×5): qty 2

## 2019-11-10 MED ORDER — ONDANSETRON HCL 4 MG PO TABS: 4.0000 mg | ORAL_TABLET | Freq: Four times a day (QID) | ORAL | Status: DC | PRN

## 2019-11-10 MED ORDER — CARVEDILOL 12.5 MG PO TABS
25.0000 mg | ORAL_TABLET | Freq: Two times a day (BID) | ORAL | Status: DC
  Administered 2019-11-10 – 2019-11-11 (×4): 25 mg via ORAL
  Filled 2019-11-10 (×4): qty 2

## 2019-11-10 MED ORDER — HYDROCODONE-ACETAMINOPHEN 5-325 MG PO TABS
1.0000 | ORAL_TABLET | Freq: Four times a day (QID) | ORAL | Status: DC | PRN
  Administered 2019-11-10 – 2019-11-11 (×2): 1 via ORAL
  Filled 2019-11-10 (×2): qty 1

## 2019-11-10 NOTE — H&P (Signed)
History and Physical    Joy Yoder AST:[id number] DOB: [birth year] DOA: 11/09/2019  PCP: Deanne Coffer, MD (Confirm with patient/family/NH records and if not entered, this has to be entered at [facility] point of entry) Patient coming from: Home  I have personally briefly reviewed patient's old medical records in [place]  Chief Complaint: Atypical chest pain and dyspnea  HPI: Joy Yoder is a 54 y.o. female with medical history significant of hypertension, hyperlipidemia, CHF, COPD with chronic hypoxic respiratory failure requiring 3 L of oxygen, diabetes mellitus and CKD presented to ED for evaluation of chest pain and shortness of breath.  Patient states that she is having episodic chest pain in both right and left side of the chest that started few days ago and continue to worsen.  Patient states that the chest pain is sharp, 10 out of 10 on pain scale, nonradiating, getting worse with exertion and temporarily relieved with rest.  Patient states that she is feeling very weak and tired, also having intermittent productive cough and episodic wheezing with the chest pain.  Patient also admits of having exertional dyspnea but denies dizziness and diaphoresis.  Patient states that she went to [facility] last night with her symptoms and she was given morphine in the ER that helped her temporarily.  Patient is also scheduled for outpatient stress test.  Patient further reported that she is recently diagnosed with DVT and started on Eliquis and she had no pulmonary embolism at that time.  Patient also mentioned that she was diagnosed with a rare blood disorder and she gets iron infusions and blood transfusions as needed.  Patient otherwise denies fever, chills, sore throat, loss of taste and smell sensation, hemoptysis, hematemesis, nausea, vomiting, abdominal pain and urinary symptoms.  ED Course: On arrival to the hospital patient had temperature of 98.3, heart rate 75, respiratory rate  20, blood pressure 162/69 and oxygen saturation 93% on 3 L of nasal cannula.  Blood work showed acute anemia with hemoglobin 6.2, BUN 32, creatinine 2.18, blood glucose 152.  EKG showed normal sinus rhythm with nonspecific ST-t changes.  Troponin negative, BNP marginally elevated while chest x-ray showed cardiomegaly and pulmonary edema.  Hemoccult was negative.  Patient was given IV Lasix 20 mg in the ED and blood transfusion also ordered.  Review of Systems: As per HPI otherwise 10 point review of systems negative.    Past Medical History:  Diagnosis Date  . Acute on chronic respiratory failure with hypoxia ([place])   . Anxiety   . CHF (congestive heart failure) ([place])   . Chronic diastolic heart failure ([place])   . Chronic kidney disease, stage III (moderate)   . COPD, severe ([place])   . DDD (degenerative disc disease)   . DDD (degenerative disc disease), thoracic    chest to back, legs,   . Diabetes mellitus   . DJD (degenerative joint disease)   . Hypertension   . Kidney disease    stage 3   . Lobar pneumonia ([place])   . Neuropathy   . Stroke [facility])     Past Surgical History:  Procedure Laterality Date  . ABDOMINAL HYSTERECTOMY    . bladder tack    . CHOLECYSTECTOMY    . EYE SURGERY    . THYROID SURGERY       reports that she has never smoked. She does not have any smokeless tobacco history on file. She reports that she does not drink alcohol or use drugs.  Allergies  Allergen  Reactions  . Metformin And Related     Diarrhea   . Nsaids     Kidney disease  . Pravastatin     Swelling   . Reglan [Metoclopramide]     shakes  . Tramadol     itching    No family history on file.  Family history is reviewed but not pertinent  Prior to Admission medications   Medication Sig Start Date End Date Taking? Authorizing Provider  amitriptyline (ELAVIL) 25 MG tablet Take 50 mg by mouth at bedtime.   Yes [provider]  amLODipine (NORVASC) 5 MG tablet Take 5 mg by mouth  daily.    Yes [provider]  atorvastatin (LIPITOR) 80 MG tablet Take 80 mg by mouth daily at 6 PM.   Yes [provider]  carvedilol (COREG) 25 MG tablet Take 25 mg by mouth 2 (two) times daily with a meal.    Yes [provider]  clopidogrel (PLAVIX) 75 MG tablet Take 75 mg by mouth daily.   Yes [provider]  cyclobenzaprine (FLEXERIL) 10 MG tablet Take 10 mg by mouth 3 (three) times daily as needed for muscle spasms.   Yes [provider]  furosemide (LASIX) 20 MG tablet Take 20 mg by mouth in the morning.   Yes [provider]  gabapentin (NEURONTIN) 600 MG tablet Take 600 mg by mouth 3 (three) times daily.    Yes [provider]  hydrALAZINE (APRESOLINE) 100 MG tablet Take 100 mg by mouth 3 (three) times daily.    Yes [provider]  HYDROcodone-acetaminophen (NORCO/VICODIN) 5-325 MG tablet Take 1 tablet by mouth every 6 (six) hours as needed for moderate pain.   Yes [provider]  hydrOXYzine (ATARAX/VISTARIL) 50 MG tablet Take 50 mg by mouth in the morning and at bedtime.    Yes [provider]  insulin lispro (HUMALOG) 100 UNIT/ML injection Inject 5-16 Units into the skin 3 (three) times daily before meals. Patient injects according to sliding scale at home.   Yes [provider]  isosorbide mononitrate (IMDUR) 30 MG 24 hr tablet Take 30 mg by mouth in the morning.    Yes [provider]  LANTUS SOLOSTAR 100 UNIT/ML Solostar Pen Inject 50 Units into the skin at bedtime.  10/19/19  Yes [provider]  pantoprazole (PROTONIX) 40 MG tablet Take 40 mg by mouth in the morning.   Yes [provider]  venlafaxine XR (EFFEXOR-XR) 150 MG 24 hr capsule Take 150 mg by mouth every evening.    Yes [provider]    Physical Exam: Vitals:   11/10/19 0200 11/10/19 0215 11/10/19 0230 11/10/19 0300  BP: (!) 187/68  (!) 189/73 (!) 175/93  Pulse: 74 74 75 77    Resp: 18 17 18 17   Temp:      TempSrc:      SpO2: 99% 98% 95% 98%  Weight:      Height:        Constitutional: NAD, calm, comfortable Vitals:   11/10/19 0200 11/10/19 0215 11/10/19 0230 11/10/19 0300  BP: (!) 187/68  (!) 189/73 (!) 175/93  Pulse: 74 74 75 77  Resp: 18 17 18 17   Temp:      TempSrc:      SpO2: 99% 98% 95% 98%  Weight:      Height:       General: Patient is a 54 year old African-American female on 3 L of oxygen with nasal cannula and  in mild respiratory distress with wheezing. Eyes: PERRL, lids and conjunctivae normal ENMT: Mucous membranes are moist. Posterior pharynx clear of any exudate or lesions.Normal dentition.  Neck: normal, supple, no masses, no thyromegaly Respiratory: Patient is on 3 L of oxygen at this time.  Diminished breath sounds in bilateral lower lobes mild diffuse wheezing bilaterally appreciated on auscultation. Normal respiratory effort. No accessory muscle use.  Cardiovascular: Chest is nontender on palpation.  Regular rate and rhythm, no murmurs / rubs / gallops. No extremity edema. 2+ pedal pulses. No carotid bruits.  Abdomen: no tenderness, no masses palpated. No hepatosplenomegaly. Bowel sounds positive.  Musculoskeletal: no clubbing / cyanosis. No joint deformity upper and lower extremities. Good ROM, no contractures. Normal muscle tone.  Skin: no rashes, lesions, ulcers. No induration Neurologic: CN 2-12 grossly intact. Sensation intact, DTR normal. Strength 5/5 in all 4.  Psychiatric: Normal judgment and insight. Alert and oriented x 3. Normal mood.    Labs on Admission: I have personally reviewed following labs and imaging studies  CBC: Recent Labs  Lab 11/09/19 2234  WBC 7.2  NEUTROABS 4.5  HGB 6.2*  HCT 21.4*  MCV 86.3  PLT 546   Basic Metabolic Panel: Recent Labs  Lab 11/09/19 2234  NA 134*  K 4.4  CL 102  CO2 27  GLUCOSE 152*  BUN 32*  CREATININE 2.18*  CALCIUM 8.4*   GFR: Estimated Creatinine Clearance:  29.2 mL/min (A) (by C-G formula based on SCr of 2.18 mg/dL (H)). Liver Function Tests: No results for input(s): AST, ALT, ALKPHOS, BILITOT, PROT, ALBUMIN in the last 168 hours. No results for input(s): LIPASE, AMYLASE in the last 168 hours. No results for input(s): AMMONIA in the last 168 hours. Coagulation Profile: No results for input(s): INR, PROTIME in the last 168 hours. Cardiac Enzymes: No results for input(s): CKTOTAL, CKMB, CKMBINDEX, TROPONINI in the last 168 hours. BNP (last 3 results) No results for input(s): PROBNP in the last 8760 hours. HbA1C: No results for input(s): HGBA1C in the last 72 hours. CBG: No results for input(s): GLUCAP in the last 168 hours. Lipid Profile: No results for input(s): CHOL, HDL, LDLCALC, TRIG, CHOLHDL, LDLDIRECT in the last 72 hours. Thyroid Function Tests: No results for input(s): TSH, T4TOTAL, FREET4, T3FREE, THYROIDAB in the last 72 hours. Anemia Panel: No results for input(s): VITAMINB12, FOLATE, FERRITIN, TIBC, IRON, RETICCTPCT in the last 72 hours. Urine analysis:    Component Value Date/Time   COLORURINE YELLOW 07/04/2017 0528   APPEARANCEUR CLEAR 07/04/2017 0528   LABSPEC 1.015 07/04/2017 0528   PHURINE 7.0 07/04/2017 0528   GLUCOSEU NEGATIVE 07/04/2017 0528   HGBUR NEGATIVE 07/04/2017 0528   BILIRUBINUR NEGATIVE 07/04/2017 0528   KETONESUR NEGATIVE 07/04/2017 0528   PROTEINUR 100 (A) 07/04/2017 0528   UROBILINOGEN 0.2 06/07/2011 1420   NITRITE NEGATIVE 07/04/2017 0528   LEUKOCYTESUR NEGATIVE 07/04/2017 0528    Radiological Exams on Admission: DG Chest Portable 1 View  Result Date: 11/09/2019 CLINICAL DATA:  Chest pain EXAM: PORTABLE CHEST 1 VIEW COMPARISON:  07/04/2017 FINDINGS: Tracheostomy tip projects over the mid trachea. Cardiomegaly with vascular congestion. Low volumes with bibasilar atelectasis. No effusions. No acute bony abnormality. IMPRESSION: Low volumes with bibasilar atelectasis. Cardiomegaly, vascular  congestion. Electronically Signed   By: Rolm Baptise M.D.   On: 11/09/2019 22:02    EKG: Independently reviewed.  EKG showed normal sinus rhythm with no acute ischemic changes.  Assessment/Plan Principal Problem:    Symptomatic anemia Patient had hemoglobin of and the  symptoms of fatigue, generalized weakness and chest pain are most probably secondary to acute symptomatic anemia. 1 unit of packed red blood cells ordered. CBC monitoring 1 hour after blood transfusion is complete. Stool Hemoccult negative and patient is also denying hematemesis, hemoptysis, melena, bloody stools and hematuria. Patient states that she has a rare blood disorder and she needs iron infusions and blood transfusions but she does not know the name of that disorder.   Active Problems:    COPD, severe ([place]) Patient is most probably having COPD exacerbation because of shortness of breath, wheezing and productive cough. Patient is still on 3 L of oxygen and does not require more oxygen than the baseline. DuoNeb scheduled and as needed ordered. Azithromycin 500 mg daily for 5 days ordered.    Hyperglycemia due to diabetes mellitus (HCC) Sliding scale insulin with blood glucose monitoring and hypoglycemic protocol ordered.    Atypical chest pain EKG and troponin negative. Patient is most likely having atypical chest pain secondary to COPD exacerbation and forceful coughing. Patient is already scheduled for outpatient stress test. Continue to monitor.  Pulmonary edema: Patient given dose of Lasix 20 mg in the ED.    DVT prophylaxis: No chemical prophylaxis because of acute anemia.  Will consider restarting on Eliquis once the hemoglobin is improved.  SCDs ordered Code Status: Full code Disposition Plan: Patient is admitted for observation and will be discharged to home once the symptoms became stable. Consults called: None Admission status: Observation/MedSurg   Edmonia Lynch MD [facility] Pager [phone]   If 7PM-7AM, please contact night-coverage [email or web address] Password   11/10/2019, 3:26 AM

## 2019-11-10 NOTE — Progress Notes (Signed)
Per HPI: Joy Yoder is a 54 y.o. female with medical history significant of hypertension, hyperlipidemia, CHF, COPD with chronic hypoxic respiratory failure requiring 3 L of oxygen, diabetes mellitus and CKD presented to ED for evaluation of chest pain and shortness of breath.  Patient states that she is having episodic chest pain in both right and left side of the chest that started few days ago and continue to worsen.  Patient states that the chest pain is sharp, 10 out of 10 on pain scale, nonradiating, getting worse with exertion and temporarily relieved with rest.  Patient states that she is feeling very weak and tired, also having intermittent productive cough and episodic wheezing with the chest pain.  Patient also admits of having exertional dyspnea but denies dizziness and diaphoresis.  Patient states that she went to [facility] last night with her symptoms and she was given morphine in the ER that helped her temporarily.  Patient is also scheduled for outpatient stress test.  Patient further reported that she is recently diagnosed with DVT and started on Eliquis and she had no pulmonary embolism at that time.  Patient also mentioned that she was diagnosed with a rare blood disorder and she gets iron infusions and blood transfusions as needed.  Patient otherwise denies fever, chills, sore throat, loss of taste and smell sensation, hemoptysis, hematemesis, nausea, vomiting, abdominal pain and urinary symptoms.  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  She has currently received 1 unit of PRBC and second unit has been ordered with recheck pending.  She appears to have chronic chest pain, but her morphine pump was recently disabled in September 2020.  She has no signs of ACS and therefore I have canceled cardiology consultation and heparin drip.  She  does not appear to have any overt bleeding and I will hold Eliquis until tomorrow a.m. until repeat CBC has been ordered.  I have called her hematologist office and have left a message to obtain medical records to be faxed to our facility for further evaluation.  Total care time: 30 minutes.

## 2019-11-10 NOTE — Progress Notes (Signed)
[place] for heparin infusion Indication:  ACS/STEMI  Allergies  Allergen Reactions  . Metformin And Related     Diarrhea   . Nsaids     Kidney disease  . Pravastatin     Swelling   . Reglan [Metoclopramide]     shakes  . Tramadol     itching    Patient Measurements: Height: 5\' 2"  (157.5 cm) Weight: 81.2 kg (179 lb 0.2 oz) IBW/kg (Calculated) : 50.1 Heparin Dosing Weight: HEPARIN DW (KG): 68.2  Vital Signs: Temp: 98.3 F (36.8 C) (04/08 0559) Temp Source: Oral (04/08 0559) BP: 189/71 (04/08 0700) Pulse Rate: 81 (04/08 0700)  Labs: Recent Labs    11/09/19 2234 11/09/19 2322 11/10/19 0650  HGB 6.2*  --  7.9*  HCT 21.4*  --  26.5*  PLT 189  --  189  APTT  --   --  35  CREATININE 2.18*  --  1.98*  TROPONINIHS 8 8 8     Estimated Creatinine Clearance: 31.7 mL/min (A) (by C-G formula based on SCr of 1.98 mg/dL (H)).   Medical History: Past Medical History:  Diagnosis Date  . Acute on chronic respiratory failure with hypoxia ([place])   . Anxiety   . CHF (congestive heart failure) ([place])   . Chronic diastolic heart failure ([place])   . Chronic kidney disease, stage III (moderate)   . COPD, severe ([place])   . DDD (degenerative disc disease)   . DDD (degenerative disc disease), thoracic    chest to back, legs,   . Diabetes mellitus   . DJD (degenerative joint disease)   . Hypertension   . Kidney disease    stage 3   . Lobar pneumonia ([place])   . Neuropathy   . Stroke [facility])     Medications:  Patient was taking apixaban 5mg  bid prior to admission, but  she doesn't recall  when she took her last dose.  Assessment: Pharmacy consulted to dose heparin for this 54 yo female with chest pain, weakness  and fatigue, possibly related to symptomatic anemia. Her baseline hemoglobin of 6.2mg /dL was treated with one unit of PRBCs; now her Hb is 7.9mg /dL.  Baseline aPTT and HL are at baseline,  so will use heparin levels only to titrate  infusion.        Goal of Therapy:  Heparin level 0.3-0.7 units/ml Monitor platelets by anticoagulation protocol: Yes   Plan:  Give 2000 units bolus x 1 Start heparin infusion at 1000 units/hr Check anti-Xa level in 6-8 hours and daily while on heparin Continue to monitor H&H and platelets  Despina Pole 11/10/2019,7:49 AM

## 2019-11-10 NOTE — Progress Notes (Signed)
CRITICAL VALUE ALERT  Critical Value:  MRSA +  Date & Time Notied:  11/10/2019 1300  Provider Notified: P. Manuella Ghazi, MD  Orders Received/Actions taken: MRSA+ order set in place.

## 2019-11-10 NOTE — Care Management Obs Status (Signed)
[facility] NOTIFICATION   Patient Details  Name: Joy Yoder MRN: [id number] Date of Birth: [birth year]   Medicare Observation Status Notification Given:  Yes    Tommy Medal 11/10/2019, 12:28 PM

## 2019-11-10 NOTE — ED Notes (Signed)
Pt c/o central chest pain.  EKG obtained.  Dr. Humphrey Rolls paged.

## 2019-11-11 ENCOUNTER — Observation Stay (HOSPITAL_COMMUNITY): Payer: 59

## 2019-11-11 DIAGNOSIS — D649 Anemia, unspecified: Secondary | ICD-10-CM | POA: Diagnosis not present

## 2019-11-11 DIAGNOSIS — R0789 Other chest pain: Secondary | ICD-10-CM | POA: Diagnosis not present

## 2019-11-11 DIAGNOSIS — Z20822 Contact with and (suspected) exposure to covid-19: Secondary | ICD-10-CM | POA: Diagnosis not present

## 2019-11-11 DIAGNOSIS — D759 Disease of blood and blood-forming organs, unspecified: Secondary | ICD-10-CM | POA: Diagnosis not present

## 2019-11-11 LAB — CBC
HCT: 32.5 % — ABNORMAL LOW (ref 36.0–46.0)
Hemoglobin: 9.8 g/dL — ABNORMAL LOW (ref 12.0–15.0)
MCH: 25.9 pg — ABNORMAL LOW (ref 26.0–34.0)
MCHC: 30.2 g/dL (ref 30.0–36.0)
MCV: 86 fL (ref 80.0–100.0)
Platelets: 200 10*3/uL (ref 150–400)
RBC: 3.78 MIL/uL — ABNORMAL LOW (ref 3.87–5.11)
RDW: 16.3 % — ABNORMAL HIGH (ref 11.5–15.5)
WBC: 6.9 10*3/uL (ref 4.0–10.5)
nRBC: 0 % (ref 0.0–0.2)

## 2019-11-11 LAB — TYPE AND SCREEN
ABO/RH(D): O POS
Antibody Screen: NEGATIVE
Unit division: 0
Unit division: 0

## 2019-11-11 LAB — GLUCOSE, CAPILLARY
Glucose-Capillary: 151 mg/dL — ABNORMAL HIGH (ref 70–99)
Glucose-Capillary: 219 mg/dL — ABNORMAL HIGH (ref 70–99)
Glucose-Capillary: 179 mg/dL — ABNORMAL HIGH (ref 70–99)

## 2019-11-11 LAB — BPAM RBC
Blood Product Expiration Date: 202104302359
Blood Product Expiration Date: 202105082359
ISSUE DATE / TIME: 202104080053
ISSUE DATE / TIME: 202104081008
Unit Type and Rh: 5100
Unit Type and Rh: 5100

## 2019-11-11 LAB — HEMOGLOBIN A1C
Hgb A1c MFr Bld: 9.7 % — ABNORMAL HIGH (ref 4.8–5.6)
Mean Plasma Glucose: 232 mg/dL

## 2019-11-11 LAB — BASIC METABOLIC PANEL
Anion gap: 9 (ref 5–15)
BUN: 29 mg/dL — ABNORMAL HIGH (ref 6–20)
CO2: 24 mmol/L (ref 22–32)
Calcium: 9.2 mg/dL (ref 8.9–10.3)
Chloride: 106 mmol/L (ref 98–111)
Creatinine, Ser: 1.83 mg/dL — ABNORMAL HIGH (ref 0.44–1.00)
GFR calc Af Amer: 35 mL/min — ABNORMAL LOW (ref >60)
GFR calc non Af Amer: 31 mL/min — ABNORMAL LOW (ref >60)
Glucose, Bld: 163 mg/dL — ABNORMAL HIGH (ref 70–99)
Potassium: 4.6 mmol/L (ref 3.5–5.1)
Sodium: 139 mmol/L (ref 135–145)

## 2019-11-11 LAB — TROPONIN I (HIGH SENSITIVITY): Troponin I (High Sensitivity): 5 ng/L (ref <18)

## 2019-11-11 MED ORDER — HYDROCODONE-ACETAMINOPHEN 5-325 MG PO TABS: 1.0000 | ORAL_TABLET | Freq: Four times a day (QID) | ORAL | 0 refills | Status: DC | PRN

## 2019-11-11 NOTE — Progress Notes (Signed)
Pt stating 10/10 CP worsening with palpation. NSR on monitor, HR 64. MD Manuella Ghazi notified of pt's complaint. Pt given PRN pain medication. Upon giving medication pt asking if she was going home with pain medication. Reviewed PTA meds and Norco is on list, when questioned pt states she only has two left and needs more prior to d/c. MD notified of pt request.

## 2019-11-11 NOTE — Progress Notes (Signed)
Pt resting with eyes closed, breathing even and unlabored. Currently awaiting daughter's arrival for d/c. Will continue to monitor.

## 2019-11-11 NOTE — Progress Notes (Signed)
**Note De-identified Joy Yoder Obfuscation** EKG complete and placed in patient chart 

## 2019-11-11 NOTE — Progress Notes (Signed)
TOC notified patient will need a follow up appointment with her urology office, [facility], as patient will discharge with a Foley catheter. CSW spoke with on-call physician, Dr. Edwin Dada, for the clinic regarding a follow up appointment for the patient. CSW provided Dr. Titus Mould with requested information. Dr. Titus Mould informed CSW patient will be called at the beginning of next week to schedule an appointment. CSW spoke with patient about answering the phone call from the urologist to schedule her appointment. Physician notified that patient will follow up with [facility] next week. TOC signing off.  Tobi Bastos, LCSW Transitions of Care Clinical Social Worker [facility] Ph: [phone]

## 2019-11-11 NOTE — Progress Notes (Signed)
Pt states she wishes to speak with MD Manuella Ghazi concerning d/c. States she does not feel ready to be d/c d/t chest pain. MD Manuella Ghazi notified. MD speaking with pt via phone at this time.

## 2019-11-11 NOTE — Progress Notes (Signed)
Per MD Manuella Ghazi if troponin is similar to prior she is cleared for d/c as EKG and chest xray are normal.

## 2019-11-11 NOTE — Discharge Summary (Addendum)
Physician Discharge Summary  Joy Yoder ZOX:[id number] DOB: 11/02/1965 DOA: 11/09/2019  PCP: Deanne Coffer, MD  Admit date: 11/09/2019  Discharge date: 11/11/2019  Admitted From:Home  Disposition:  Home  Recommendations for Outpatient Follow-up:  1. Follow up with PCP in 1-2 weeks and repeat CBC in 1 week 2. Follow-up with oncologist Dr. Junius Roads in 1 week 3. Follow-up with urology as scheduled in 1 week and maintain Foley catheter on discharge.  Will need voiding trial outpatient. 4. Continue home medications as prior  Home Health: None  Equipment/Devices: Has home nasal cannula oxygen and will discharge with Foley  Discharge Condition: Stable  CODE STATUS: Full  Diet recommendation: Heart Healthy/carb modified  Brief/Interim Summary: Per HPI: Joy Yoder a 54 y.o.femalewith medical history significant ofhypertension, hyperlipidemia, CHF, COPD with chronic hypoxic respiratory failure requiring 3 L of oxygen, diabetes mellitus and CKD presented to ED for evaluation of chest pain and shortness of breath. Patient states that she is having episodic chest pain in both right and left side of the chest that started few days ago and continue to worsen. Patient states that the chest pain is sharp, 10 out of 10 on pain scale, nonradiating, getting worse with exertion and temporarily relieved with rest. Patient states that she is feeling very weak and tired, also having intermittent productive cough and episodic wheezing with the chest pain. Patient also admits of having exertional dyspnea but denies dizziness and diaphoresis. Patient states that she went to [facility] last night with her symptoms and she was given morphine in the ER that helped her temporarily. Patient is also scheduled for outpatient stress test. Patient further reported that she is recently diagnosed with DVT and started on Eliquis and she had no pulmonary embolism at that time. Patient also mentioned  that she was diagnosed with a rare blood disorder and she gets iron infusions and blood transfusions as needed. Patient otherwise denies fever, chills, sore throat, loss of taste and smell sensation, hemoptysis, hematemesis, nausea, vomiting, abdominal pain and urinary symptoms.  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  She has currently received 1 unit of PRBC and second unit has been ordered with recheck pending.  She appears to have chronic chest pain, but her morphine pump was recently disabled in September 2020.  She has no signs of ACS and therefore I have canceled cardiology consultation and heparin drip.  She does not appear to have any overt bleeding and I will hold Eliquis until tomorrow a.m. until repeat CBC has been ordered.  I have called her hematologist office and have left a message to obtain medical records to be faxed to our facility for further evaluation.  4/9: Patient is doing better this morning with no further chest pain or abdominal pain.  She was noted to have some abdominal swelling overnight with bladder scan revealing 1 L of urine retention for which Foley catheter has been placed with significant relief.  She denies any further chest pain and hemoglobin levels have improved to 9.8 this morning with stable vital signs.  She is scheduled for stress test in the outpatient setting and will follow up with her specialists as scheduled.  She no longer needs any treatment for COPD as she does not appear to have any wheezing or COPD symptoms.  She has had no overt bleeding during this admission and may resume her home  Eliquis as prior.  Discharge Diagnoses:  Principal Problem:   Symptomatic anemia Active Problems:   COPD, severe (HCC)   Hyperglycemia due to diabetes mellitus (HCC)   Atypical chest pain  Principal discharge diagnosis:  Acute on chronic symptomatic anemia status post 2 unit PRBC transfusion.  Discharge Instructions  Discharge Instructions    Diet - low sodium heart healthy   Complete by: As directed    Increase activity slowly   Complete by: As directed      Allergies as of 11/11/2019      Reactions   Metformin And Related    Diarrhea   Nsaids    Kidney disease   Pravastatin    Swelling   Reglan [metoclopramide]    shakes   Tramadol    itching      Medication List    TAKE these medications   amitriptyline 25 MG tablet Commonly known as: ELAVIL Take 50 mg by mouth at bedtime.   amLODipine 5 MG tablet Commonly known as: NORVASC Take 5 mg by mouth daily.   atorvastatin 80 MG tablet Commonly known as: LIPITOR Take 80 mg by mouth daily at 6 PM.   carvedilol 25 MG tablet Commonly known as: COREG Take 25 mg by mouth 2 (two) times daily with a meal.   clopidogrel 75 MG tablet Commonly known as: PLAVIX Take 75 mg by mouth daily.   cyclobenzaprine 10 MG tablet Commonly known as: FLEXERIL Take 10 mg by mouth 3 (three) times daily as needed for muscle spasms.   Eliquis 5 MG Tabs tablet Generic drug: apixaban Take 5 mg by mouth 2 (two) times daily.   furosemide 20 MG tablet Commonly known as: LASIX Take 20 mg by mouth in the morning.   gabapentin 600 MG tablet Commonly known as: NEURONTIN Take 600 mg by mouth 3 (three) times daily.   hydrALAZINE 100 MG tablet Commonly known as: APRESOLINE Take 100 mg by mouth 3 (three) times daily.   HYDROcodone-acetaminophen 5-325 MG tablet Commonly known as: NORCO/VICODIN Take 1 tablet by mouth every 6 (six) hours as needed for moderate pain.   hydrOXYzine 50 MG tablet Commonly known as: ATARAX/VISTARIL Take 50 mg by mouth in the morning and at bedtime.   insulin lispro 100 UNIT/ML injection Commonly known as: HUMALOG Inject 5-16 Units into the skin 3 (three) times daily before meals. Patient injects according to sliding scale at  home.   isosorbide mononitrate 30 MG 24 hr tablet Commonly known as: IMDUR Take 30 mg by mouth in the morning.   Lantus SoloStar 100 UNIT/ML Solostar Pen Generic drug: insulin glargine Inject 50 Units into the skin at bedtime.   pantoprazole 40 MG tablet Commonly known as: PROTONIX Take 40 mg by mouth in the morning.   venlafaxine XR 150 MG 24 hr capsule Commonly known as: EFFEXOR-XR Take 150 mg by mouth every evening.      Follow-up Information    Deanne Coffer, MD Follow up in 1 week(s).   Specialty: Internal Medicine Contact information: [place] [address] [phone]        Laurena Slimmer, MD Follow up in 1 week(s).   Specialty: Oncology Contact information: [place] [address] [place] Follow up in 1 week(s).   Contact information: [address] [place] [address] [facility]. Call.  Why: [address], [place] [address] Phone: [phone] The office will call you next week to set up a follow up appointment.         Allergies  Allergen Reactions  . Metformin And Related     Diarrhea   . Nsaids     Kidney disease  . Pravastatin     Swelling   . Reglan [Metoclopramide]     shakes  . Tramadol     itching    Consultations:  None   Procedures/Studies: DG Chest Portable 1 View  Result Date: 11/09/2019 CLINICAL DATA:  Chest pain EXAM: PORTABLE CHEST 1 VIEW COMPARISON:  07/04/2017 FINDINGS: Tracheostomy tip projects over the mid trachea. Cardiomegaly with vascular congestion. Low volumes with bibasilar atelectasis. No effusions. No acute bony abnormality. IMPRESSION: Low volumes with bibasilar atelectasis. Cardiomegaly, vascular congestion. Electronically Signed   By: Rolm Baptise M.D.   On: 11/09/2019 22:02    Discharge Exam: Vitals:   11/11/19 1300 11/11/19 1400  BP: (!) 127/51 140/61   Pulse: (!) 59 (!) 57  Resp: 12 12  Temp:    SpO2: 100% 100%   Vitals:   11/11/19 1000 11/11/19 1200 11/11/19 1300 11/11/19 1400  BP: (!) 144/55 (!) 198/75 (!) 127/51 140/61  Pulse: (!) 57 61 (!) 59 (!) 57  Resp: 13 15 12 12   Temp:      TempSrc:      SpO2: 100% 100% 100% 100%  Weight:      Height:        General: Pt is alert, awake, not in acute distress, tracheostomy Cardiovascular: RRR, S1/S2 +, no rubs, no gallops Respiratory: CTA bilaterally, no wheezing, no rhonchi Abdominal: Soft, NT, ND, bowel sounds + Extremities: no edema, no cyanosis    The results of significant diagnostics from this hospitalization (including imaging, microbiology, ancillary and laboratory) are listed below for reference.     Microbiology: Recent Results (from the past 240 hour(s))  SARS CORONAVIRUS 2 (TAT 6-24 HRS) Nasopharyngeal Nasopharyngeal Swab     Status: None   Collection Time: 11/10/19  1:21 AM   Specimen: Nasopharyngeal Swab  Result Value Ref Range Status   SARS Coronavirus 2 NEGATIVE NEGATIVE Final    Comment: (NOTE) SARS-CoV-2 target nucleic acids are NOT DETECTED. The SARS-CoV-2 RNA is generally detectable in upper and lower respiratory specimens during the acute phase of infection. Negative results do not preclude SARS-CoV-2 infection, do not rule out co-infections with other pathogens, and should not be used as the sole basis for treatment or other patient management decisions. Negative results must be combined with clinical observations, patient history, and epidemiological information. The expected result is Negative. Fact Sheet for Patients: SugarRoll.be Fact Sheet for Healthcare Providers: https://www.woods-mathews.com/ This test is not yet approved or cleared by the [place] FDA and  has been authorized for detection and/or diagnosis of SARS-CoV-2 by FDA under an Emergency Use Authorization (EUA). This EUA will remain  in  effect (meaning this test can be used) for the duration of the COVID-19 declaration under Section 56 4(b)(1) of the Act, 21 U.S.C. section 360bbb-3(b)(1), unless the authorization is terminated or revoked sooner. Performed at [facility], [place] [address] [place], [place] [address]   Respiratory Panel by RT PCR (Flu A&B, Covid) - Nasopharyngeal Swab     Status: None   Collection Time: 11/10/19  4:48 AM   Specimen: Nasopharyngeal Swab  Result Value Ref Range Status   SARS Coronavirus 2 by RT PCR  NEGATIVE NEGATIVE Final    Comment: (NOTE) SARS-CoV-2 target nucleic acids are NOT DETECTED. The SARS-CoV-2 RNA is generally detectable in upper respiratoy specimens during the acute phase of infection. The lowest concentration of SARS-CoV-2 viral copies this assay can detect is 131 copies/mL. A negative result does not preclude SARS-Cov-2 infection and should not be used as the sole basis for treatment or other patient management decisions. A negative result may occur with  improper specimen collection/handling, submission of specimen other than nasopharyngeal swab, presence of viral mutation(s) within the areas targeted by this assay, and inadequate number of viral copies (<131 copies/mL). A negative result must be combined with clinical observations, patient history, and epidemiological information. The expected result is Negative. Fact Sheet for Patients:  PinkCheek.be Fact Sheet for Healthcare Providers:  GravelBags.it This test is not yet ap proved or cleared by the [place] FDA and  has been authorized for detection and/or diagnosis of SARS-CoV-2 by FDA under an Emergency Use Authorization (EUA). This EUA will remain  in effect (meaning this test can be used) for the duration of the COVID-19 declaration under Section 564(b)(1) of the Act, 21 U.S.C. section 360bbb-3(b)(1), unless the authorization is terminated  or revoked sooner.    Influenza A by PCR NEGATIVE NEGATIVE Final   Influenza B by PCR NEGATIVE NEGATIVE Final    Comment: (NOTE) The Xpert Xpress SARS-CoV-2/FLU/RSV assay is intended as an aid in  the diagnosis of influenza from Nasopharyngeal swab specimens and  should not be used as a sole basis for treatment. Nasal washings and  aspirates are unacceptable for Xpert Xpress SARS-CoV-2/FLU/RSV  testing. Fact Sheet for Patients: PinkCheek.be Fact Sheet for Healthcare Providers: GravelBags.it This test is not yet approved or cleared by the [place] FDA and  has been authorized for detection and/or diagnosis of SARS-CoV-2 by  FDA under an Emergency Use Authorization (EUA). This EUA will remain  in effect (meaning this test can be used) for the duration of the  Covid-19 declaration under Section 564(b)(1) of the Act, 21  U.S.C. section 360bbb-3(b)(1), unless the authorization is  terminated or revoked. Performed at [facility], [address] [place], [place] [address]   MRSA PCR Screening     Status: Abnormal   Collection Time: 11/10/19 11:43 AM   Specimen: Nasal Mucosa; Nasopharyngeal  Result Value Ref Range Status   MRSA by PCR POSITIVE (A) NEGATIVE Final    Comment:        The GeneXpert MRSA Assay (FDA approved for NASAL specimens only), is one component of a comprehensive MRSA colonization surveillance program. It is not intended to diagnose MRSA infection nor to guide or monitor treatment for MRSA infections. RESULT CALLED TO, READ BACK BY AND VERIFIED WITH: FOLEY B. AT 1255P ON [id number] BY THOMPSON S. Performed at [facility], [address], [place], [place] [address]      Labs: BNP (last 3 results) Recent Labs    11/09/19 2235  BNP 384.6*   Basic Metabolic Panel: Recent Labs  Lab 11/09/19 2234 11/10/19 0650 11/11/19 0434  NA 134* 139 139  K 4.4 4.7 4.6  CL 102 104 106  CO2 27 27 24   GLUCOSE  152* 134* 163*  BUN 32* 30* 29*  CREATININE 2.18* 1.98* 1.83*  CALCIUM 8.4* 9.0 9.2   Liver Function Tests: Recent Labs  Lab 11/10/19 0650  AST 16  ALT 18  ALKPHOS 85  BILITOT 0.5  PROT 8.8*  ALBUMIN 3.7   No results for input(s):  LIPASE, AMYLASE in the last 168 hours. No results for input(s): AMMONIA in the last 168 hours. CBC: Recent Labs  Lab 11/09/19 2234 11/10/19 0650 11/10/19 1455 11/11/19 0434  WBC 7.2 8.0  --  6.9  NEUTROABS 4.5  --   --   --   HGB 6.2* 7.9* 9.1* 9.8*  HCT 21.4* 26.5* 30.3* 32.5*  MCV 86.3 87.5  --  86.0  PLT 189 189  --  200   Cardiac Enzymes: No results for input(s): CKTOTAL, CKMB, CKMBINDEX, TROPONINI in the last 168 hours. BNP: Invalid input(s): POCBNP CBG: Recent Labs  Lab 11/10/19 1358 11/10/19 1553 11/10/19 2204 11/11/19 0743 11/11/19 1153  GLUCAP 135* 122* 168* 151* 219*   D-Dimer No results for input(s): DDIMER in the last 72 hours. Hgb A1c Recent Labs    11/09/19 2322  HGBA1C 9.7*   Lipid Profile No results for input(s): CHOL, HDL, LDLCALC, TRIG, CHOLHDL, LDLDIRECT in the last 72 hours. Thyroid function studies No results for input(s): TSH, T4TOTAL, T3FREE, THYROIDAB in the last 72 hours.  Invalid input(s): FREET3 Anemia work up No results for input(s): VITAMINB12, FOLATE, FERRITIN, TIBC, IRON, RETICCTPCT in the last 72 hours. Urinalysis    Component Value Date/Time   COLORURINE YELLOW 07/04/2017 0528   APPEARANCEUR CLEAR 07/04/2017 0528   LABSPEC 1.015 07/04/2017 0528   PHURINE 7.0 07/04/2017 0528   GLUCOSEU NEGATIVE 07/04/2017 0528   HGBUR NEGATIVE 07/04/2017 0528   BILIRUBINUR NEGATIVE 07/04/2017 0528   KETONESUR NEGATIVE 07/04/2017 0528   PROTEINUR 100 (A) 07/04/2017 0528   UROBILINOGEN 0.2 06/07/2011 1420   NITRITE NEGATIVE 07/04/2017 0528   LEUKOCYTESUR NEGATIVE 07/04/2017 0528   Sepsis Labs Invalid input(s): PROCALCITONIN,  WBC,  LACTICIDVEN Microbiology Recent Results (from the past 240  hour(s))  SARS CORONAVIRUS 2 (TAT 6-24 HRS) Nasopharyngeal Nasopharyngeal Swab     Status: None   Collection Time: 11/10/19  1:21 AM   Specimen: Nasopharyngeal Swab  Result Value Ref Range Status   SARS Coronavirus 2 NEGATIVE NEGATIVE Final    Comment: (NOTE) SARS-CoV-2 target nucleic acids are NOT DETECTED. The SARS-CoV-2 RNA is generally detectable in upper and lower respiratory specimens during the acute phase of infection. Negative results do not preclude SARS-CoV-2 infection, do not rule out co-infections with other pathogens, and should not be used as the sole basis for treatment or other patient management decisions. Negative results must be combined with clinical observations, patient history, and epidemiological information. The expected result is Negative. Fact Sheet for Patients: SugarRoll.be Fact Sheet for Healthcare Providers: https://www.woods-mathews.com/ This test is not yet approved or cleared by the [place] FDA and  has been authorized for detection and/or diagnosis of SARS-CoV-2 by FDA under an Emergency Use Authorization (EUA). This EUA will remain  in effect (meaning this test can be used) for the duration of the COVID-19 declaration under Section 56 4(b)(1) of the Act, 21 U.S.C. section 360bbb-3(b)(1), unless the authorization is terminated or revoked sooner. Performed at [facility], [place] [address] [place], [place] [address]   Respiratory Panel by RT PCR (Flu A&B, Covid) - Nasopharyngeal Swab     Status: None   Collection Time: 11/10/19  4:48 AM   Specimen: Nasopharyngeal Swab  Result Value Ref Range Status   SARS Coronavirus 2 by RT PCR NEGATIVE NEGATIVE Final    Comment: (NOTE) SARS-CoV-2 target nucleic acids are NOT DETECTED. The SARS-CoV-2 RNA is generally detectable in upper respiratoy specimens during the acute phase of infection. The lowest concentration  of SARS-CoV-2 viral copies this assay can  detect is 131 copies/mL. A negative result does not preclude SARS-Cov-2 infection and should not be used as the sole basis for treatment or other patient management decisions. A negative result may occur with  improper specimen collection/handling, submission of specimen other than nasopharyngeal swab, presence of viral mutation(s) within the areas targeted by this assay, and inadequate number of viral copies (<131 copies/mL). A negative result must be combined with clinical observations, patient history, and epidemiological information. The expected result is Negative. Fact Sheet for Patients:  PinkCheek.be Fact Sheet for Healthcare Providers:  GravelBags.it This test is not yet ap proved or cleared by the [place] FDA and  has been authorized for detection and/or diagnosis of SARS-CoV-2 by FDA under an Emergency Use Authorization (EUA). This EUA will remain  in effect (meaning this test can be used) for the duration of the COVID-19 declaration under Section 564(b)(1) of the Act, 21 U.S.C. section 360bbb-3(b)(1), unless the authorization is terminated or revoked sooner.    Influenza A by PCR NEGATIVE NEGATIVE Final   Influenza B by PCR NEGATIVE NEGATIVE Final    Comment: (NOTE) The Xpert Xpress SARS-CoV-2/FLU/RSV assay is intended as an aid in  the diagnosis of influenza from Nasopharyngeal swab specimens and  should not be used as a sole basis for treatment. Nasal washings and  aspirates are unacceptable for Xpert Xpress SARS-CoV-2/FLU/RSV  testing. Fact Sheet for Patients: PinkCheek.be Fact Sheet for Healthcare Providers: GravelBags.it This test is not yet approved or cleared by the [place] FDA and  has been authorized for detection and/or diagnosis of SARS-CoV-2 by  FDA under an Emergency Use Authorization (EUA). This EUA will remain  in effect (meaning  this test can be used) for the duration of the  Covid-19 declaration under Section 564(b)(1) of the Act, 21  U.S.C. section 360bbb-3(b)(1), unless the authorization is  terminated or revoked. Performed at [facility], [address] [place], [place] [address]   MRSA PCR Screening     Status: Abnormal   Collection Time: 11/10/19 11:43 AM   Specimen: Nasal Mucosa; Nasopharyngeal  Result Value Ref Range Status   MRSA by PCR POSITIVE (A) NEGATIVE Final    Comment:        The GeneXpert MRSA Assay (FDA approved for NASAL specimens only), is one component of a comprehensive MRSA colonization surveillance program. It is not intended to diagnose MRSA infection nor to guide or monitor treatment for MRSA infections. RESULT CALLED TO, READ BACK BY AND VERIFIED WITH: FOLEY B. AT 1255P ON [id number] BY THOMPSON S. Performed at [facility], [address] [place], [place] [address]      Time coordinating discharge: 35 minutes  SIGNED:   Rodena Goldmann, DO [facility] 11/11/2019, 3:21 PM  If 7PM-7AM, please contact night-coverage [email or web address]

## 2019-11-14 ENCOUNTER — Emergency Department (HOSPITAL_COMMUNITY)
Admission: EM | Admit: 2019-11-14 | Discharge: 2019-11-14 | Disposition: A | Discharge status: Home / Self Care | Payer: 59 | Attending: Emergency Medicine | Admitting: Emergency Medicine

## 2019-11-14 ENCOUNTER — Encounter (HOSPITAL_COMMUNITY): Payer: Self-pay | Admitting: Emergency Medicine

## 2019-11-14 ENCOUNTER — Other Ambulatory Visit: Payer: Self-pay

## 2019-11-14 DIAGNOSIS — T839XXA Unspecified complication of genitourinary prosthetic device, implant and graft, initial encounter: Secondary | ICD-10-CM

## 2019-11-14 DIAGNOSIS — Z79899 Other long term (current) drug therapy: Secondary | ICD-10-CM | POA: Diagnosis not present

## 2019-11-14 DIAGNOSIS — Z7901 Long term (current) use of anticoagulants: Secondary | ICD-10-CM | POA: Insufficient documentation

## 2019-11-14 DIAGNOSIS — E1122 Type 2 diabetes mellitus with diabetic chronic kidney disease: Secondary | ICD-10-CM | POA: Diagnosis not present

## 2019-11-14 DIAGNOSIS — Z466 Encounter for fitting and adjustment of urinary device: Secondary | ICD-10-CM | POA: Insufficient documentation

## 2019-11-14 DIAGNOSIS — I5032 Chronic diastolic (congestive) heart failure: Secondary | ICD-10-CM | POA: Insufficient documentation

## 2019-11-14 DIAGNOSIS — I130 Hypertensive heart and chronic kidney disease with heart failure and stage 1 through stage 4 chronic kidney disease, or unspecified chronic kidney disease: Secondary | ICD-10-CM | POA: Diagnosis not present

## 2019-11-14 DIAGNOSIS — N183 Chronic kidney disease, stage 3 unspecified: Secondary | ICD-10-CM | POA: Diagnosis not present

## 2019-11-14 DIAGNOSIS — Z794 Long term (current) use of insulin: Secondary | ICD-10-CM | POA: Insufficient documentation

## 2019-11-14 DIAGNOSIS — L292 Pruritus vulvae: Secondary | ICD-10-CM | POA: Insufficient documentation

## 2019-11-14 NOTE — ED Triage Notes (Signed)
Pt c/o vaginal itching and irritation. Pt has foley catheter placed on Friday the 9th of this month and started having irritation on Saturday.

## 2019-11-14 NOTE — ED Notes (Signed)
Pt requesting to have foley removed. States that it is aggravating her and she needs it taken out tonight. Pt called urology to follow up since discharge but was told that they couldn't see her til next week.

## 2019-11-14 NOTE — Discharge Instructions (Addendum)
Follow up with urology as planned.

## 2019-11-16 NOTE — ED Provider Notes (Signed)
[facility] EMERGENCY DEPARTMENT Provider Note   CSN: [id number] Arrival date & time: 11/14/19  1844     History Chief Complaint  Patient presents with  . Vaginal Itching    Joy Yoder is a 54 y.o. female.  Patient states she has a Foley catheter in and she wanted to get out she spoke to urology and they said she could come to the ED to get it taken out  The history is provided by the patient and medical records. No language interpreter was used.  Dysuria Pain quality:  Aching Pain severity:  No pain Onset quality:  Sudden Timing:  Constant Progression:  Worsening Chronicity:  Recurrent Recent urinary tract infections: no   Relieved by:  Nothing Worsened by:  Nothing Ineffective treatments:  None tried Urinary symptoms: no discolored urine   Associated symptoms: no abdominal pain        Past Medical History:  Diagnosis Date  . Acute on chronic respiratory failure with hypoxia ([place])   . Anxiety   . CHF (congestive heart failure) ([place])   . Chronic diastolic heart failure ([place])   . Chronic kidney disease, stage III (moderate)   . COPD, severe ([place])   . DDD (degenerative disc disease)   . DDD (degenerative disc disease), thoracic    chest to back, legs,   . Diabetes mellitus   . DJD (degenerative joint disease)   . Hypertension   . Kidney disease    stage 3   . Lobar pneumonia ([place])   . Neuropathy   . Stroke [facility])     Patient Active Problem List   Diagnosis Date Noted  . Symptomatic anemia 11/10/2019  . Hyperglycemia due to diabetes mellitus ([place]) 11/10/2019  . Atypical chest pain 11/10/2019  . Acute on chronic respiratory failure with hypoxia ([place])   . Chronic diastolic heart failure ([place])   . Chronic kidney disease, stage III (moderate)   . COPD, severe ([place])   . Lobar pneumonia ([place])   . AKI (acute kidney injury) ([place]) 07/07/2017  . Chronic pain syndrome 07/07/2017  . Intractable nausea and vomiting 07/06/2017  . Intractable pain  07/04/2017  . Hypertension 07/04/2017  . Type 2 diabetes mellitus without complication ([place]) 46/96/2952    Past Surgical History:  Procedure Laterality Date  . ABDOMINAL HYSTERECTOMY    . bladder tack    . CHOLECYSTECTOMY    . EYE SURGERY    . THYROID SURGERY       OB History   No obstetric history on file.     History reviewed. No pertinent family history.  Social History   Tobacco Use  . Smoking status: Never Smoker  . Smokeless tobacco: Never Used  Substance Use Topics  . Alcohol use: Never  . Drug use: No    Home Medications Prior to Admission medications   Medication Sig Start Date End Date Taking? Authorizing Provider  amitriptyline (ELAVIL) 25 MG tablet Take 50 mg by mouth at bedtime.   Yes [provider]  amLODipine (NORVASC) 5 MG tablet Take 5 mg by mouth daily.    Yes [provider]  atorvastatin (LIPITOR) 80 MG tablet Take 80 mg by mouth daily at 6 PM.   Yes [provider]  carvedilol (COREG) 25 MG tablet Take 25 mg by mouth 2 (two) times daily with a meal.    Yes [provider]  clopidogrel (PLAVIX) 75 MG tablet Take 75 mg by mouth daily.   Yes [provider]  cyclobenzaprine (FLEXERIL) 10 MG tablet Take 10 mg by mouth 3 (three) times daily as needed for muscle spasms.   Yes [provider]  furosemide (LASIX) 20 MG tablet Take 20 mg by mouth in the morning.   Yes [provider]  gabapentin (NEURONTIN) 600 MG tablet Take 600 mg by mouth 3 (three) times daily.    Yes [provider]  hydrALAZINE (APRESOLINE) 100 MG tablet Take 100 mg by mouth 3 (three) times daily.    Yes [provider]  HYDROcodone-acetaminophen (NORCO/VICODIN) 5-325 MG tablet Take 1 tablet by mouth every 6 (six) hours as needed for moderate pain. 11/11/19  Yes Shah, Pratik D, DO  hydrOXYzine (ATARAX/VISTARIL) 50 MG tablet Take 50 mg by mouth in the morning and at bedtime.    Yes [provider]    insulin lispro (HUMALOG) 100 UNIT/ML injection Inject 5-16 Units into the skin 3 (three) times daily before meals. Patient injects according to sliding scale at home.   Yes [provider]  isosorbide mononitrate (IMDUR) 30 MG 24 hr tablet Take 30 mg by mouth in the morning.    Yes [provider]  LANTUS SOLOSTAR 100 UNIT/ML Solostar Pen Inject 50 Units into the skin at bedtime.  10/19/19  Yes [provider]  pantoprazole (PROTONIX) 40 MG tablet Take 40 mg by mouth in the morning.   Yes [provider]  venlafaxine XR (EFFEXOR-XR) 150 MG 24 hr capsule Take 150 mg by mouth every evening.    Yes [provider]  apixaban (ELIQUIS) 5 MG TABS tablet Take 5 mg by mouth 2 (two) times daily.    [provider]    Allergies    Metformin and related, Nsaids, Pravastatin, Reglan [metoclopramide], Tramadol, and Vancomycin  Review of Systems   Review of Systems  Constitutional: Negative for appetite change and fatigue.  HENT: Negative for congestion, ear discharge and sinus pressure.   Eyes: Negative for discharge.  Respiratory: Negative for cough.   Cardiovascular: Negative for chest pain.  Gastrointestinal: Negative for abdominal pain and diarrhea.  Genitourinary: Negative for frequency and hematuria.       Pain with fall  Musculoskeletal: Negative for back pain.  Skin: Negative for rash.  Neurological: Negative for seizures and headaches.  Psychiatric/Behavioral: Negative for hallucinations.    Physical Exam Updated Vital Signs BP (!) 167/78 (BP Location: Right Arm)   Pulse 88   Temp 98.4 F (36.9 C) (Oral)   Resp 17   Ht 5\' 2"  (1.575 m)   Wt 82.2 kg   SpO2 96%   BMI 33.15 kg/m   Physical Exam Vitals and nursing note reviewed.  Constitutional:      Appearance: She is well-developed.  HENT:     Head: Normocephalic.     Nose: Nose normal.  Eyes:     General: No scleral icterus.    Conjunctiva/sclera: Conjunctivae normal.   Neck:     Thyroid: No thyromegaly.  Cardiovascular:     Rate and Rhythm: Normal rate.     Heart sounds: No murmur. No friction rub. No gallop.   Pulmonary:     Breath sounds: No stridor. No wheezing or rales.  Chest:     Chest wall: No tenderness.  Abdominal:     General: There is no distension.     Tenderness: There is no abdominal tenderness. There is no rebound.  Genitourinary:    Comments: Foley catheter placed Musculoskeletal:        General:  Normal range of motion.     Cervical back: Neck supple.  Lymphadenopathy:     Cervical: No cervical adenopathy.  Skin:    Findings: No erythema or rash.  Neurological:     Mental Status: She is alert and oriented to person, place, and time.     Motor: No abnormal muscle tone.     Coordination: Coordination normal.  Psychiatric:        Behavior: Behavior normal.     ED Results / Procedures / Treatments   Labs (all labs ordered are listed, but only abnormal results are displayed) Labs Reviewed - No data to display  EKG None  Radiology No results found.  Procedures Procedures (including critical care time)  Medications Ordered in ED Medications - No data to display  ED Course  I have reviewed the triage vital signs and the nursing notes.  Pertinent labs & imaging results that were available during my care of the patient were reviewed by me and considered in my medical decision making (see chart for details).    MDM Rules/Calculators/A&P                     Patient had her Foley catheter removed.  She will follow up with her urologist.  Patient only came to the emergency department to have the Foley removed Final Clinical Impression(s) / ED Diagnoses Final diagnoses:  Problem with Foley catheter, initial encounter [facility])    Rx / DC Orders ED Discharge Orders    None       Milton Ferguson, MD 11/16/19 1050

## 2020-03-18 ENCOUNTER — Emergency Department (HOSPITAL_COMMUNITY): Payer: 59

## 2020-03-18 ENCOUNTER — Inpatient Hospital Stay (HOSPITAL_COMMUNITY)
Admission: EM | Admit: 2020-03-18 | Discharge: 2020-03-22 | DRG: 871 | Disposition: A | Discharge status: Home Health | Payer: 59 | Attending: Internal Medicine | Admitting: Internal Medicine

## 2020-03-18 ENCOUNTER — Other Ambulatory Visit: Payer: Self-pay

## 2020-03-18 ENCOUNTER — Encounter (HOSPITAL_COMMUNITY): Payer: Self-pay | Admitting: Emergency Medicine

## 2020-03-18 DIAGNOSIS — N183 Chronic kidney disease, stage 3 unspecified: Secondary | ICD-10-CM | POA: Diagnosis present

## 2020-03-18 DIAGNOSIS — R0789 Other chest pain: Secondary | ICD-10-CM | POA: Diagnosis present

## 2020-03-18 DIAGNOSIS — I10 Essential (primary) hypertension: Secondary | ICD-10-CM | POA: Diagnosis present

## 2020-03-18 DIAGNOSIS — R0600 Dyspnea, unspecified: Secondary | ICD-10-CM

## 2020-03-18 DIAGNOSIS — K219 Gastro-esophageal reflux disease without esophagitis: Secondary | ICD-10-CM | POA: Diagnosis present

## 2020-03-18 DIAGNOSIS — R109 Unspecified abdominal pain: Secondary | ICD-10-CM

## 2020-03-18 DIAGNOSIS — J449 Chronic obstructive pulmonary disease, unspecified: Secondary | ICD-10-CM | POA: Diagnosis present

## 2020-03-18 DIAGNOSIS — L8990 Pressure ulcer of unspecified site, unspecified stage: Secondary | ICD-10-CM | POA: Diagnosis present

## 2020-03-18 DIAGNOSIS — I160 Hypertensive urgency: Secondary | ICD-10-CM | POA: Diagnosis present

## 2020-03-18 DIAGNOSIS — E538 Deficiency of other specified B group vitamins: Secondary | ICD-10-CM | POA: Diagnosis present

## 2020-03-18 DIAGNOSIS — I248 Other forms of acute ischemic heart disease: Secondary | ICD-10-CM | POA: Diagnosis present

## 2020-03-18 DIAGNOSIS — J9621 Acute and chronic respiratory failure with hypoxia: Secondary | ICD-10-CM | POA: Diagnosis present

## 2020-03-18 DIAGNOSIS — R1084 Generalized abdominal pain: Secondary | ICD-10-CM

## 2020-03-18 DIAGNOSIS — E785 Hyperlipidemia, unspecified: Secondary | ICD-10-CM | POA: Diagnosis present

## 2020-03-18 DIAGNOSIS — L97509 Non-pressure chronic ulcer of other part of unspecified foot with unspecified severity: Secondary | ICD-10-CM | POA: Diagnosis present

## 2020-03-18 DIAGNOSIS — F418 Other specified anxiety disorders: Secondary | ICD-10-CM | POA: Diagnosis present

## 2020-03-18 DIAGNOSIS — G8929 Other chronic pain: Secondary | ICD-10-CM | POA: Diagnosis present

## 2020-03-18 DIAGNOSIS — K5900 Constipation, unspecified: Secondary | ICD-10-CM | POA: Diagnosis present

## 2020-03-18 DIAGNOSIS — E1122 Type 2 diabetes mellitus with diabetic chronic kidney disease: Secondary | ICD-10-CM | POA: Diagnosis present

## 2020-03-18 DIAGNOSIS — E1121 Type 2 diabetes mellitus with diabetic nephropathy: Secondary | ICD-10-CM

## 2020-03-18 DIAGNOSIS — M879 Osteonecrosis, unspecified: Secondary | ICD-10-CM | POA: Diagnosis present

## 2020-03-18 DIAGNOSIS — Z794 Long term (current) use of insulin: Secondary | ICD-10-CM

## 2020-03-18 DIAGNOSIS — J690 Pneumonitis due to inhalation of food and vomit: Secondary | ICD-10-CM | POA: Diagnosis not present

## 2020-03-18 DIAGNOSIS — A419 Sepsis, unspecified organism: Secondary | ICD-10-CM | POA: Diagnosis not present

## 2020-03-18 DIAGNOSIS — J189 Pneumonia, unspecified organism: Secondary | ICD-10-CM

## 2020-03-18 DIAGNOSIS — I5032 Chronic diastolic (congestive) heart failure: Secondary | ICD-10-CM | POA: Diagnosis present

## 2020-03-18 DIAGNOSIS — I130 Hypertensive heart and chronic kidney disease with heart failure and stage 1 through stage 4 chronic kidney disease, or unspecified chronic kidney disease: Secondary | ICD-10-CM | POA: Diagnosis present

## 2020-03-18 DIAGNOSIS — D631 Anemia in chronic kidney disease: Secondary | ICD-10-CM | POA: Diagnosis present

## 2020-03-18 DIAGNOSIS — Z20822 Contact with and (suspected) exposure to covid-19: Secondary | ICD-10-CM | POA: Diagnosis present

## 2020-03-18 DIAGNOSIS — R112 Nausea with vomiting, unspecified: Secondary | ICD-10-CM

## 2020-03-18 DIAGNOSIS — Z8673 Personal history of transient ischemic attack (TIA), and cerebral infarction without residual deficits: Secondary | ICD-10-CM

## 2020-03-18 DIAGNOSIS — E11621 Type 2 diabetes mellitus with foot ulcer: Secondary | ICD-10-CM | POA: Diagnosis present

## 2020-03-18 DIAGNOSIS — E1165 Type 2 diabetes mellitus with hyperglycemia: Secondary | ICD-10-CM | POA: Diagnosis present

## 2020-03-18 DIAGNOSIS — Z79899 Other long term (current) drug therapy: Secondary | ICD-10-CM

## 2020-03-18 DIAGNOSIS — E611 Iron deficiency: Secondary | ICD-10-CM | POA: Diagnosis present

## 2020-03-18 DIAGNOSIS — N1832 Chronic kidney disease, stage 3b: Secondary | ICD-10-CM | POA: Diagnosis present

## 2020-03-18 LAB — BASIC METABOLIC PANEL
Anion gap: 7 (ref 5–15)
BUN: 30 mg/dL — ABNORMAL HIGH (ref 6–20)
CO2: 24 mmol/L (ref 22–32)
Calcium: 7.8 mg/dL — ABNORMAL LOW (ref 8.9–10.3)
Chloride: 106 mmol/L (ref 98–111)
Creatinine, Ser: 1.64 mg/dL — ABNORMAL HIGH (ref 0.44–1.00)
GFR calc Af Amer: 40 mL/min — ABNORMAL LOW (ref >60)
GFR calc non Af Amer: 35 mL/min — ABNORMAL LOW (ref >60)
Glucose, Bld: 240 mg/dL — ABNORMAL HIGH (ref 70–99)
Potassium: 4.4 mmol/L (ref 3.5–5.1)
Sodium: 137 mmol/L (ref 135–145)

## 2020-03-18 LAB — CBC WITH DIFFERENTIAL/PLATELET
Abs Immature Granulocytes: 0.06 10*3/uL (ref 0.00–0.07)
Abs Immature Granulocytes: 0.07 10*3/uL (ref 0.00–0.07)
Basophils Absolute: 0 10*3/uL (ref 0.0–0.1)
Basophils Absolute: 0 10*3/uL (ref 0.0–0.1)
Basophils Relative: 0 %
Basophils Relative: 0 %
Eosinophils Absolute: 0 10*3/uL (ref 0.0–0.5)
Eosinophils Absolute: 0 10*3/uL (ref 0.0–0.5)
Eosinophils Relative: 0 %
Eosinophils Relative: 0 %
HCT: 29.8 % — ABNORMAL LOW (ref 36.0–46.0)
HCT: 26.1 % — ABNORMAL LOW (ref 36.0–46.0)
Hemoglobin: 9 g/dL — ABNORMAL LOW (ref 12.0–15.0)
Hemoglobin: 7.9 g/dL — ABNORMAL LOW (ref 12.0–15.0)
Immature Granulocytes: 1 %
Immature Granulocytes: 1 %
Lymphocytes Relative: 13 %
Lymphocytes Relative: 12 %
Lymphs Abs: 1.4 10*3/uL (ref 0.7–4.0)
Lymphs Abs: 1.1 10*3/uL (ref 0.7–4.0)
MCH: 26.3 pg (ref 26.0–34.0)
MCH: 26.5 pg (ref 26.0–34.0)
MCHC: 30.2 g/dL (ref 30.0–36.0)
MCHC: 30.3 g/dL (ref 30.0–36.0)
MCV: 87.1 fL (ref 80.0–100.0)
MCV: 87.6 fL (ref 80.0–100.0)
Monocytes Absolute: 1.4 10*3/uL — ABNORMAL HIGH (ref 0.1–1.0)
Monocytes Absolute: 1 10*3/uL (ref 0.1–1.0)
Monocytes Relative: 14 %
Monocytes Relative: 12 %
Neutro Abs: 7.4 10*3/uL (ref 1.7–7.7)
Neutro Abs: 6.7 10*3/uL (ref 1.7–7.7)
Neutrophils Relative %: 72 %
Neutrophils Relative %: 75 %
Platelets: 222 10*3/uL (ref 150–400)
Platelets: 198 10*3/uL (ref 150–400)
RBC: 3.42 MIL/uL — ABNORMAL LOW (ref 3.87–5.11)
RBC: 2.98 MIL/uL — ABNORMAL LOW (ref 3.87–5.11)
RDW: 16 % — ABNORMAL HIGH (ref 11.5–15.5)
RDW: 16 % — ABNORMAL HIGH (ref 11.5–15.5)
WBC: 10.3 10*3/uL (ref 4.0–10.5)
WBC: 8.9 10*3/uL (ref 4.0–10.5)
nRBC: 0.2 % (ref 0.0–0.2)
nRBC: 0 % (ref 0.0–0.2)

## 2020-03-18 LAB — URINALYSIS, ROUTINE W REFLEX MICROSCOPIC
Bacteria, UA: NONE SEEN
Bilirubin Urine: NEGATIVE
Glucose, UA: 50 mg/dL — AB
Ketones, ur: NEGATIVE mg/dL
Leukocytes,Ua: NEGATIVE
Nitrite: NEGATIVE
Protein, ur: >=300 mg/dL — AB
Specific Gravity, Urine: 1.023 (ref 1.005–1.030)
pH: 6 (ref 5.0–8.0)

## 2020-03-18 LAB — COMPREHENSIVE METABOLIC PANEL
ALT: 19 U/L (ref 0–44)
AST: 18 U/L (ref 15–41)
Albumin: 3.2 g/dL — ABNORMAL LOW (ref 3.5–5.0)
Alkaline Phosphatase: 77 U/L (ref 38–126)
Anion gap: 7 (ref 5–15)
BUN: 33 mg/dL — ABNORMAL HIGH (ref 6–20)
CO2: 25 mmol/L (ref 22–32)
Calcium: 8.3 mg/dL — ABNORMAL LOW (ref 8.9–10.3)
Chloride: 105 mmol/L (ref 98–111)
Creatinine, Ser: 1.71 mg/dL — ABNORMAL HIGH (ref 0.44–1.00)
GFR calc Af Amer: 38 mL/min — ABNORMAL LOW (ref >60)
GFR calc non Af Amer: 33 mL/min — ABNORMAL LOW (ref >60)
Glucose, Bld: 197 mg/dL — ABNORMAL HIGH (ref 70–99)
Potassium: 4.2 mmol/L (ref 3.5–5.1)
Sodium: 137 mmol/L (ref 135–145)
Total Bilirubin: 0.4 mg/dL (ref 0.3–1.2)
Total Protein: 8.3 g/dL — ABNORMAL HIGH (ref 6.5–8.1)

## 2020-03-18 LAB — SARS CORONAVIRUS 2 BY RT PCR (HOSPITAL ORDER, PERFORMED IN ~~LOC~~ HOSPITAL LAB): SARS Coronavirus 2: NEGATIVE

## 2020-03-18 LAB — LACTIC ACID, PLASMA: Lactic Acid, Venous: 1 mmol/L (ref 0.5–1.9)

## 2020-03-18 LAB — LIPASE, BLOOD: Lipase: 29 U/L (ref 11–51)

## 2020-03-18 MED ORDER — SODIUM CHLORIDE 0.9 % IV SOLN
500.0000 mg | INTRAVENOUS | Status: DC
  Administered 2020-03-18 – 2020-03-20 (×3): 500 mg via INTRAVENOUS
  Filled 2020-03-18 (×3): qty 500

## 2020-03-18 MED ORDER — CLONIDINE HCL 0.2 MG PO TABS
0.2000 mg | ORAL_TABLET | Freq: Once | ORAL | Status: AC
  Administered 2020-03-18: 0.2 mg via ORAL
  Filled 2020-03-18: qty 2

## 2020-03-18 MED ORDER — LACTATED RINGERS IV BOLUS (SEPSIS)
1000.0000 mL | Freq: Once | INTRAVENOUS | Status: AC
  Administered 2020-03-18: 1000 mL via INTRAVENOUS

## 2020-03-18 MED ORDER — IOHEXOL 300 MG/ML  SOLN
75.0000 mL | Freq: Once | INTRAMUSCULAR | Status: AC | PRN
  Administered 2020-03-18: 75 mL via INTRAVENOUS

## 2020-03-18 MED ORDER — LACTATED RINGERS IV SOLN: INTRAVENOUS | Status: AC

## 2020-03-18 MED ORDER — LACTATED RINGERS IV BOLUS (SEPSIS)
500.0000 mL | Freq: Once | INTRAVENOUS | Status: AC
  Administered 2020-03-18: 500 mL via INTRAVENOUS

## 2020-03-18 MED ORDER — MORPHINE SULFATE (PF) 4 MG/ML IV SOLN
4.0000 mg | Freq: Once | INTRAVENOUS | Status: AC
  Administered 2020-03-18: 4 mg via INTRAVENOUS
  Filled 2020-03-18: qty 1

## 2020-03-18 MED ORDER — SODIUM CHLORIDE 0.9 % IV BOLUS
1000.0000 mL | Freq: Once | INTRAVENOUS | Status: AC
  Administered 2020-03-18: 1000 mL via INTRAVENOUS

## 2020-03-18 MED ORDER — SODIUM CHLORIDE 0.9 % IV SOLN
2.0000 g | INTRAVENOUS | Status: DC
  Administered 2020-03-18 – 2020-03-20 (×3): 2 g via INTRAVENOUS
  Filled 2020-03-18 (×3): qty 20

## 2020-03-18 MED ORDER — ACETAMINOPHEN 325 MG PO TABS
650.0000 mg | ORAL_TABLET | Freq: Once | ORAL | Status: AC
  Administered 2020-03-18: 650 mg via ORAL
  Filled 2020-03-18: qty 2

## 2020-03-18 MED ORDER — HYDRALAZINE HCL 25 MG PO TABS
100.0000 mg | ORAL_TABLET | Freq: Once | ORAL | Status: AC
  Administered 2020-03-18: 100 mg via ORAL
  Filled 2020-03-18: qty 4

## 2020-03-18 MED ORDER — ONDANSETRON HCL 4 MG/2ML IJ SOLN
4.0000 mg | Freq: Once | INTRAMUSCULAR | Status: AC
  Administered 2020-03-18: 4 mg via INTRAVENOUS
  Filled 2020-03-18: qty 2

## 2020-03-18 NOTE — ED Provider Notes (Signed)
3:30 PM-checkout from Dr. Stark Jock to evaluate after CT imaging.  She presented today for evaluation of abdominal pain and vomiting.  She was apparently evaluated earlier today at an outlying hospital.  Screening test indicated urine with presence of blood, and protein, lipase normal, metabolic panel with elevated glucose, BUN high, creatinine high, calcium low, total protein, CBC with hemoglobin low   CT findings per radiologist: IMPRESSION:  1. No acute process in the abdomen or pelvis.  2. Possible constipation.  3. Left greater than right base airspace disease, most consistent  with infection or aspiration.  4. Aortic Atherosclerosis (ICD10-I70.0).  5. 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    Wt Readings from Last 3 Encounters:  03/18/20 78.5 kg  11/14/19 82.2 kg  11/11/19 81.9 kg   Temp Readings from Last 3 Encounters:  03/18/20 (!) 102.7 F (39.3 C) (Oral)  11/14/19 98.4 F (36.9 C) (Oral)  11/11/19 97.8 F (36.6 C) (Oral)   BP Readings from Last 3 Encounters:  03/18/20 (!) 194/54  11/14/19 (!) 167/78  11/11/19 (!) 180/79   Pulse Readings from Last 3 Encounters:  03/18/20 75  11/14/19 88  11/11/19 63    2115-nursing staff move the patient to neuro because the anticipated hospitalization.  They also felt her tracheostomy need to be cleared out because she was "choking."  Respiratory therapy, removed her cannula, and suctioned some mucus from her tracheostomy which improved her discomfort.  Temperature checked couple months ago and was elevated.  Currently patient lying left lateral decubitus complaining of "chest pain" which started within the last hour.  She denies cough, weakness or dizziness.  2135-additional medication ordered, sepsis order set initiated.  Empiric antibiotics begun for suspected respiratory infection.  Note that she had presented with abdominal pain however CT did not show any acute intra-abdominal abnormalities.  At this time  patient is groaning and complaining of chest discomfort.   Patient Vitals for the past 24 hrs:  BP Temp Temp src Pulse Resp SpO2 Height Weight  03/18/20 2128 (!) 194/54 -- -- 75 -- 99 % -- --  03/18/20 2113 -- (!) 102.7 F (39.3 C) Oral -- -- -- -- --  03/18/20 2111 (!) 208/58 -- -- 77 -- 100 % -- --  03/18/20 2106 (!) 222/73 -- -- 77 -- 99 % -- --  03/18/20 2000 (!) 199/72 -- -- 70 16 100 % -- --  03/18/20 1800 (!) 217/76 -- -- 75 -- 100 % -- --  03/18/20 1730 (!) 228/85 -- -- 82 -- 100 % -- --  03/18/20 1116 -- -- -- -- -- -- 5\' 2"  (1.575 m) 78.5 kg  03/18/20 1115 -- -- -- -- -- 99 % -- --  03/18/20 1113 (!) 216/85 (!) 100.4 F (38 C) Oral 78 (!) 24 98 % -- --    11:52 PM Reevaluation with update and discussion. After initial assessment and treatment, an updated evaluation reveals she states that she feels somewhat better, with less chest pain, but would like to stay in the hospital tonight. Findings discussed with the patient and her family member, all questions answered. Daleen Bo   Medical Decision Making:  This patient is presenting for evaluation of abdominal pain and chest pain, which does require a range of treatment options, and is a complaint that involves a high risk of morbidity and mortality. The differential diagnoses include acute intra-abdominal process, pulmonary infection, sepsis, metabolic instability. I decided to review old records, and in summary middle-aged female  with history of diabetes and hypertension.  She has chronic pain and intermittent constipation.  She has history of a tracheal disorder requiring tracheostomy, and is currently maintained with a cannula.  Patient's abdominal pain improved in the ED but she developed chest pain.  She developed a fever requiring additional evaluation and treatment.  I obtained additional historical information from family member at the bedside.  Clinical Laboratory Tests Ordered, included CBC, Metabolic panel and  Lactate, blood cultures, urinalysis.  Repeat CBC and metabolic panel. Radiologic Tests Ordered, included CT abdomen pelvis, chest x-ray.  I independently Visualized: Radiographic images, which show abdomen pelvis CT with constipation, chest x-ray with possible left lower lobe infiltrate   Critical Interventions-clinical evaluation, laboratory testing, CT imaging, secondary blood testing,  chest x-ray, observation reassessment  After These Interventions, the Patient was reevaluated and was found with a secondary problem.  She has presented with abdominal pain on arrival to the ED, was being readied for discharge after being given her blood pressure medicines, when she developed chest pain and fever.  Chest x-ray indicated pneumonia.  She had some mucus in her tracheostomy that required suctioning.  Treated for sepsis with respiratory antibiotics and high-volume LR bolus.  Repeat CBC is reassuring.  CRITICAL CARE- yes Performed by: Daleen Bo  .Critical Care Performed by: Daleen Bo, MD Authorized by: Daleen Bo, MD   Critical care provider statement:    Critical care time (minutes):  50   Critical care start time:  03/18/2020 3:30 PM   Critical care end time:  03/18/2020 10:22 PM   Critical care time was exclusive of:  Separately billable procedures and treating other patients   Critical care was necessary to treat or prevent imminent or life-threatening deterioration of the following conditions:  Respiratory failure   Critical care was time spent personally by me on the following activities:  Blood draw for specimens, development of treatment plan with patient or surrogate, discussions with consultants, evaluation of patient's response to treatment, examination of patient, obtaining history from patient or surrogate, ordering and performing treatments and interventions, ordering and review of laboratory studies, pulse oximetry, re-evaluation of patient's condition, review of old charts  and ordering and review of radiographic studies      Nursing Notes Reviewed/ Care Coordinated Applicable Imaging Reviewed Interpretation of Laboratory Data incorporated into ED treatment  11:52 PM-Consult complete with hospitalist. Patient case explained and discussed. He agrees to admit patient for further evaluation and treatment. Call ended at 1158 PM       Daleen Bo, MD 03/21/20 1152

## 2020-03-18 NOTE — ED Notes (Signed)
Two nurses attempted to get IV access and blood cultures. Lab notified that attempts were not successful. Per order the antibiotics were not held for blood cultures.

## 2020-03-18 NOTE — ED Provider Notes (Signed)
[facility] EMERGENCY DEPARTMENT Provider Note   CSN: [id number] Arrival date & time: 03/18/20  1041     History Chief Complaint  Patient presents with  . Emesis    Joy Yoder is a 54 y.o. female.  Patient is a 54 year old female with history of COPD with tracheostomy, CHF, chronic renal insufficiency, diabetes.  She presents today for evaluation of abdominal pain.  Patient describes the onset of nausea and vomiting since early this morning and is now describing generalized abdominal pain.  She denies any fevers or chills.  She denies any black or bloody stools.  She denies any diarrhea or ill contacts.  Patient initially presented to [facility] this morning.  She tells me they did test but did not inform her of the results and was discharged without seeing a physician.  The history is provided by the patient.  Emesis Severity:  Moderate Duration:  12 hours Timing:  Constant Quality:  Stomach contents Progression:  Worsening Chronicity:  New Recent urination:  Normal Relieved by:  Nothing Worsened by:  Nothing Ineffective treatments:  None tried Associated symptoms: abdominal pain   Associated symptoms: no chills and no fever        Past Medical History:  Diagnosis Date  . Acute on chronic respiratory failure with hypoxia ([place])   . Anxiety   . CHF (congestive heart failure) ([place])   . Chronic diastolic heart failure ([place])   . Chronic kidney disease, stage III (moderate)   . COPD, severe ([place])   . DDD (degenerative disc disease)   . DDD (degenerative disc disease), thoracic    chest to back, legs,   . Diabetes mellitus   . DJD (degenerative joint disease)   . Hypertension   . Kidney disease    stage 3   . Lobar pneumonia ([place])   . Neuropathy   . Stroke [facility])     Patient Active Problem List   Diagnosis Date Noted  . Symptomatic anemia 11/10/2019  . Hyperglycemia due to diabetes mellitus ([place]) 11/10/2019  . Atypical chest pain 11/10/2019    . Acute on chronic respiratory failure with hypoxia ([place])   . Chronic diastolic heart failure ([place])   . Chronic kidney disease, stage III (moderate)   . COPD, severe ([place])   . Lobar pneumonia ([place])   . AKI (acute kidney injury) ([place]) 07/07/2017  . Chronic pain syndrome 07/07/2017  . Intractable nausea and vomiting 07/06/2017  . Intractable pain 07/04/2017  . Hypertension 07/04/2017  . Type 2 diabetes mellitus without complication ([place]) 53/66/4403    Past Surgical History:  Procedure Laterality Date  . ABDOMINAL HYSTERECTOMY    . bladder tack    . CHOLECYSTECTOMY    . EYE SURGERY    . THYROID SURGERY       OB History   No obstetric history on file.     History reviewed. No pertinent family history.  Social History   Tobacco Use  . Smoking status: Never Smoker  . Smokeless tobacco: Never Used  Substance Use Topics  . Alcohol use: Never  . Drug use: No    Home Medications Prior to Admission medications   Medication Sig Start Date End Date Taking? Authorizing Provider  amitriptyline (ELAVIL) 25 MG tablet Take 50 mg by mouth at bedtime.    [provider]  amLODipine (NORVASC) 5 MG tablet Take 5 mg by mouth daily.     [provider]  apixaban (ELIQUIS) 5 MG TABS tablet Take 5  mg by mouth 2 (two) times daily.    [provider]  atorvastatin (LIPITOR) 80 MG tablet Take 80 mg by mouth daily at 6 PM.    [provider]  carvedilol (COREG) 25 MG tablet Take 25 mg by mouth 2 (two) times daily with a meal.     [provider]  clopidogrel (PLAVIX) 75 MG tablet Take 75 mg by mouth daily.    [provider]  cyclobenzaprine (FLEXERIL) 10 MG tablet Take 10 mg by mouth 3 (three) times daily as needed for muscle spasms.    [provider]  furosemide (LASIX) 20 MG tablet Take 20 mg by mouth in the morning.    [provider]  gabapentin (NEURONTIN) 600 MG tablet Take 600 mg by mouth 3 (three) times daily.      [provider]  hydrALAZINE (APRESOLINE) 100 MG tablet Take 100 mg by mouth 3 (three) times daily.     [provider]  HYDROcodone-acetaminophen (NORCO/VICODIN) 5-325 MG tablet Take 1 tablet by mouth every 6 (six) hours as needed for moderate pain. 11/11/19   Manuella Ghazi, Pratik D, DO  hydrOXYzine (ATARAX/VISTARIL) 50 MG tablet Take 50 mg by mouth in the morning and at bedtime.     [provider]  insulin lispro (HUMALOG) 100 UNIT/ML injection Inject 5-16 Units into the skin 3 (three) times daily before meals. Patient injects according to sliding scale at home.    [provider]  isosorbide mononitrate (IMDUR) 30 MG 24 hr tablet Take 30 mg by mouth in the morning.     [provider]  LANTUS SOLOSTAR 100 UNIT/ML Solostar Pen Inject 50 Units into the skin at bedtime.  10/19/19   [provider]  pantoprazole (PROTONIX) 40 MG tablet Take 40 mg by mouth in the morning.    [provider]  venlafaxine XR (EFFEXOR-XR) 150 MG 24 hr capsule Take 150 mg by mouth every evening.     [provider]    Allergies    Metformin and related, Nsaids, Pravastatin, Reglan [metoclopramide], Tramadol, and Vancomycin  Review of Systems   Review of Systems  Constitutional: Negative for chills and fever.  Gastrointestinal: Positive for abdominal pain and vomiting.  All other systems reviewed and are negative.   Physical Exam Updated Vital Signs BP (!) 216/85 (BP Location: Right Arm)   Pulse 78   Temp (!) 100.4 F (38 C) (Oral)   Resp (!) 24   Ht 5\' 2"  (1.575 m)   Wt 78.5 kg   SpO2 99% Comment: O2@ 4l/m from home  BMI 31.64 kg/m   Physical Exam Vitals and nursing note reviewed.  Constitutional:      General: She is not in acute distress.    Appearance: She is well-developed. She is not diaphoretic.  HENT:     Head: Normocephalic and atraumatic.  Cardiovascular:     Rate and Rhythm: Normal rate and regular rhythm.     Heart sounds:  No murmur heard.  No friction rub. No gallop.   Pulmonary:     Effort: Pulmonary effort is normal. No respiratory distress.     Breath sounds: Normal breath sounds. No wheezing.  Abdominal:     General: Bowel sounds are normal. There is no distension.     Palpations: Abdomen is soft.     Tenderness: There is abdominal tenderness. There is no right CVA tenderness, left CVA tenderness, guarding or rebound.     Comments: Is slight tenderness to  palpation, however seems to be most pronounced in the left lower quadrant and suprapubic region.  Musculoskeletal:        General: Normal range of motion.     Cervical back: Normal range of motion and neck supple.  Skin:    General: Skin is warm and dry.  Neurological:     Mental Status: She is alert and oriented to person, place, and time.     ED Results / Procedures / Treatments   Labs (all labs ordered are listed, but only abnormal results are displayed) Labs Reviewed  COMPREHENSIVE METABOLIC PANEL  CBC WITH DIFFERENTIAL/PLATELET  LIPASE, BLOOD  URINALYSIS, ROUTINE W REFLEX MICROSCOPIC    EKG None  Radiology No results found.  Procedures Procedures (including critical care time)  Medications Ordered in ED Medications  sodium chloride 0.9 % bolus 1,000 mL (has no administration in time range)  ondansetron (ZOFRAN) injection 4 mg (has no administration in time range)  morphine 4 MG/ML injection 4 mg (has no administration in time range)    ED Course  I have reviewed the triage vital signs and the nursing notes.  Pertinent labs & imaging results that were available during my care of the patient were reviewed by me and considered in my medical decision making (see chart for details).  Clinical Course as of Mar 19 740  Nancy Fetter Mar 18, 2020  2143 Glucose(!): 197 [EW]  2219 Consistent with possible left base infiltrate per radiologist report.  I reviewed the film and agree.  DG Chest Port 1 View [EW]  2313 Normal  CBC with  Differential(!) [EW]  2313 Patient is more comfortable at this time resting easily.  Blood pressure improved, less hypertensive.   [EW]  2335 Normal except glucose high, BUN high, creatinine high, calcium low, GFR low  Basic metabolic panel(!) [EW]  2841 Normal  SARS Coronavirus 2 by RT PCR (hospital order, performed in [facility] hospital lab) Nasopharyngeal Nasopharyngeal Swab [EW]    Clinical Course User Index [EW] Daleen Bo, MD   MDM Rules/Calculators/A&P  Patient with extensive past medical history as described in the HPI presenting with complaints of abdominal pain and vomiting.  Patient's laboratory studies are basically reassuring, however she does have tenderness in the suprapubic region and left lower quadrant.  Patient to undergo CT scan.  Final disposition pending.  Care will be signed out to Dr. Eulis Foster at shift change.  He will obtain the results of the CT scan and determine the final disposition.  Final Clinical Impression(s) / ED Diagnoses Final diagnoses:  None    Rx / DC Orders ED Discharge Orders    None       Veryl Speak, MD 03/19/20 [phone]

## 2020-03-18 NOTE — ED Triage Notes (Addendum)
Pt reports being treated in [facility] and sent home this am at 0930.  Pt says she is still vomiting.  Vomited about 4 times this am.  C/o weakness.  Pt wear 4 liters of O2 at home.  Pt h as been vaccinated for COVID.

## 2020-03-19 DIAGNOSIS — J690 Pneumonitis due to inhalation of food and vomit: Secondary | ICD-10-CM

## 2020-03-19 DIAGNOSIS — E11621 Type 2 diabetes mellitus with foot ulcer: Secondary | ICD-10-CM | POA: Diagnosis present

## 2020-03-19 DIAGNOSIS — E538 Deficiency of other specified B group vitamins: Secondary | ICD-10-CM | POA: Diagnosis present

## 2020-03-19 DIAGNOSIS — Z794 Long term (current) use of insulin: Secondary | ICD-10-CM

## 2020-03-19 DIAGNOSIS — R0789 Other chest pain: Secondary | ICD-10-CM | POA: Diagnosis not present

## 2020-03-19 DIAGNOSIS — E785 Hyperlipidemia, unspecified: Secondary | ICD-10-CM | POA: Diagnosis present

## 2020-03-19 DIAGNOSIS — E611 Iron deficiency: Secondary | ICD-10-CM | POA: Diagnosis present

## 2020-03-19 DIAGNOSIS — E1165 Type 2 diabetes mellitus with hyperglycemia: Secondary | ICD-10-CM | POA: Diagnosis present

## 2020-03-19 DIAGNOSIS — N1832 Chronic kidney disease, stage 3b: Secondary | ICD-10-CM

## 2020-03-19 DIAGNOSIS — M879 Osteonecrosis, unspecified: Secondary | ICD-10-CM | POA: Diagnosis present

## 2020-03-19 DIAGNOSIS — E1122 Type 2 diabetes mellitus with diabetic chronic kidney disease: Secondary | ICD-10-CM | POA: Diagnosis present

## 2020-03-19 DIAGNOSIS — R109 Unspecified abdominal pain: Secondary | ICD-10-CM

## 2020-03-19 DIAGNOSIS — E119 Type 2 diabetes mellitus without complications: Secondary | ICD-10-CM

## 2020-03-19 DIAGNOSIS — G8929 Other chronic pain: Secondary | ICD-10-CM | POA: Diagnosis present

## 2020-03-19 DIAGNOSIS — R112 Nausea with vomiting, unspecified: Secondary | ICD-10-CM

## 2020-03-19 DIAGNOSIS — I248 Other forms of acute ischemic heart disease: Secondary | ICD-10-CM | POA: Diagnosis present

## 2020-03-19 DIAGNOSIS — D631 Anemia in chronic kidney disease: Secondary | ICD-10-CM | POA: Diagnosis present

## 2020-03-19 DIAGNOSIS — K5900 Constipation, unspecified: Secondary | ICD-10-CM

## 2020-03-19 DIAGNOSIS — R6520 Severe sepsis without septic shock: Secondary | ICD-10-CM

## 2020-03-19 DIAGNOSIS — J449 Chronic obstructive pulmonary disease, unspecified: Secondary | ICD-10-CM

## 2020-03-19 DIAGNOSIS — J9621 Acute and chronic respiratory failure with hypoxia: Secondary | ICD-10-CM

## 2020-03-19 DIAGNOSIS — I10 Essential (primary) hypertension: Secondary | ICD-10-CM

## 2020-03-19 DIAGNOSIS — F418 Other specified anxiety disorders: Secondary | ICD-10-CM | POA: Diagnosis present

## 2020-03-19 DIAGNOSIS — I130 Hypertensive heart and chronic kidney disease with heart failure and stage 1 through stage 4 chronic kidney disease, or unspecified chronic kidney disease: Secondary | ICD-10-CM | POA: Diagnosis present

## 2020-03-19 DIAGNOSIS — L97509 Non-pressure chronic ulcer of other part of unspecified foot with unspecified severity: Secondary | ICD-10-CM | POA: Diagnosis present

## 2020-03-19 DIAGNOSIS — A419 Sepsis, unspecified organism: Secondary | ICD-10-CM | POA: Diagnosis present

## 2020-03-19 DIAGNOSIS — I5032 Chronic diastolic (congestive) heart failure: Secondary | ICD-10-CM

## 2020-03-19 DIAGNOSIS — Z20822 Contact with and (suspected) exposure to covid-19: Secondary | ICD-10-CM | POA: Diagnosis present

## 2020-03-19 DIAGNOSIS — I160 Hypertensive urgency: Secondary | ICD-10-CM | POA: Diagnosis present

## 2020-03-19 DIAGNOSIS — K219 Gastro-esophageal reflux disease without esophagitis: Secondary | ICD-10-CM | POA: Diagnosis present

## 2020-03-19 DIAGNOSIS — L8990 Pressure ulcer of unspecified site, unspecified stage: Secondary | ICD-10-CM | POA: Diagnosis present

## 2020-03-19 DIAGNOSIS — J9601 Acute respiratory failure with hypoxia: Secondary | ICD-10-CM

## 2020-03-19 DIAGNOSIS — J189 Pneumonia, unspecified organism: Secondary | ICD-10-CM | POA: Diagnosis not present

## 2020-03-19 LAB — PROTIME-INR
INR: 1.3 — ABNORMAL HIGH (ref 0.8–1.2)
Prothrombin Time: 15.7 seconds — ABNORMAL HIGH (ref 11.4–15.2)

## 2020-03-19 LAB — CBC
HCT: 28.1 % — ABNORMAL LOW (ref 36.0–46.0)
Hemoglobin: 8.2 g/dL — ABNORMAL LOW (ref 12.0–15.0)
MCH: 25.6 pg — ABNORMAL LOW (ref 26.0–34.0)
MCHC: 29.2 g/dL — ABNORMAL LOW (ref 30.0–36.0)
MCV: 87.8 fL (ref 80.0–100.0)
Platelets: 193 10*3/uL (ref 150–400)
RBC: 3.2 MIL/uL — ABNORMAL LOW (ref 3.87–5.11)
RDW: 16.1 % — ABNORMAL HIGH (ref 11.5–15.5)
WBC: 10.9 10*3/uL — ABNORMAL HIGH (ref 4.0–10.5)
nRBC: 0 % (ref 0.0–0.2)

## 2020-03-19 LAB — POC OCCULT BLOOD, ED: Fecal Occult Bld: NEGATIVE

## 2020-03-19 LAB — HEMOGLOBIN A1C
Hgb A1c MFr Bld: 8.8 % — ABNORMAL HIGH (ref 4.8–5.6)
Mean Plasma Glucose: 205.86 mg/dL

## 2020-03-19 LAB — TROPONIN I (HIGH SENSITIVITY)
Troponin I (High Sensitivity): 64 ng/L — ABNORMAL HIGH (ref <18)
Troponin I (High Sensitivity): 78 ng/L — ABNORMAL HIGH (ref <18)

## 2020-03-19 LAB — APTT: aPTT: 40 seconds — ABNORMAL HIGH (ref 24–36)

## 2020-03-19 LAB — LACTIC ACID, PLASMA: Lactic Acid, Venous: 0.9 mmol/L (ref 0.5–1.9)

## 2020-03-19 LAB — GLUCOSE, CAPILLARY
Glucose-Capillary: 277 mg/dL — ABNORMAL HIGH (ref 70–99)
Glucose-Capillary: 195 mg/dL — ABNORMAL HIGH (ref 70–99)

## 2020-03-19 LAB — CBG MONITORING, ED
Glucose-Capillary: 280 mg/dL — ABNORMAL HIGH (ref 70–99)
Glucose-Capillary: 275 mg/dL — ABNORMAL HIGH (ref 70–99)

## 2020-03-19 LAB — PHOSPHORUS: Phosphorus: 3.6 mg/dL (ref 2.5–4.6)

## 2020-03-19 LAB — MAGNESIUM: Magnesium: 1.3 mg/dL — ABNORMAL LOW (ref 1.7–2.4)

## 2020-03-19 MED ORDER — CHLORHEXIDINE GLUCONATE 0.12 % MT SOLN
15.0000 mL | Freq: Two times a day (BID) | OROMUCOSAL | Status: DC
  Administered 2020-03-20 – 2020-03-22 (×5): 15 mL via OROMUCOSAL
  Filled 2020-03-19 (×5): qty 15

## 2020-03-19 MED ORDER — PANTOPRAZOLE SODIUM 40 MG PO TBEC
40.0000 mg | DELAYED_RELEASE_TABLET | Freq: Every morning | ORAL | Status: DC
  Administered 2020-03-19 – 2020-03-22 (×3): 40 mg via ORAL
  Filled 2020-03-19 (×3): qty 1

## 2020-03-19 MED ORDER — IPRATROPIUM-ALBUTEROL 0.5-2.5 (3) MG/3ML IN SOLN
3.0000 mL | RESPIRATORY_TRACT | Status: DC | PRN
  Administered 2020-03-19 – 2020-03-22 (×10): 3 mL via RESPIRATORY_TRACT
  Filled 2020-03-19 (×10): qty 3

## 2020-03-19 MED ORDER — CLOPIDOGREL BISULFATE 75 MG PO TABS
75.0000 mg | ORAL_TABLET | Freq: Every day | ORAL | Status: DC
  Administered 2020-03-19 – 2020-03-22 (×4): 75 mg via ORAL
  Filled 2020-03-19 (×4): qty 1

## 2020-03-19 MED ORDER — METHYLPREDNISOLONE SODIUM SUCC 125 MG IJ SOLR
60.0000 mg | Freq: Once | INTRAMUSCULAR | Status: AC
  Administered 2020-03-19: 60 mg via INTRAVENOUS
  Filled 2020-03-19: qty 2

## 2020-03-19 MED ORDER — ONDANSETRON HCL 4 MG/2ML IJ SOLN: 4.0000 mg | Freq: Four times a day (QID) | INTRAMUSCULAR | Status: DC | PRN

## 2020-03-19 MED ORDER — ISOSORBIDE MONONITRATE ER 60 MG PO TB24
30.0000 mg | ORAL_TABLET | Freq: Every morning | ORAL | Status: DC
  Administered 2020-03-20 – 2020-03-22 (×3): 30 mg via ORAL
  Filled 2020-03-19 (×5): qty 1

## 2020-03-19 MED ORDER — AMLODIPINE BESYLATE 5 MG PO TABS
5.0000 mg | ORAL_TABLET | Freq: Every day | ORAL | Status: DC
  Administered 2020-03-19 – 2020-03-21 (×3): 5 mg via ORAL
  Filled 2020-03-19 (×3): qty 1

## 2020-03-19 MED ORDER — ACETAMINOPHEN 325 MG PO TABS
650.0000 mg | ORAL_TABLET | Freq: Four times a day (QID) | ORAL | Status: DC | PRN
  Administered 2020-03-19: 650 mg via ORAL
  Filled 2020-03-19: qty 2

## 2020-03-19 MED ORDER — MORPHINE SULFATE (PF) 2 MG/ML IV SOLN
2.0000 mg | Freq: Once | INTRAVENOUS | Status: AC
  Administered 2020-03-19: 2 mg via INTRAVENOUS
  Filled 2020-03-19: qty 1

## 2020-03-19 MED ORDER — INSULIN ASPART 100 UNIT/ML ~~LOC~~ SOLN: 0.0000 [IU] | Freq: Every day | SUBCUTANEOUS | Status: DC

## 2020-03-19 MED ORDER — LIDOCAINE 5 % EX PTCH
1.0000 | MEDICATED_PATCH | CUTANEOUS | Status: DC
  Administered 2020-03-20 – 2020-03-22 (×3): 1 via TRANSDERMAL
  Filled 2020-03-19 (×3): qty 1

## 2020-03-19 MED ORDER — DM-GUAIFENESIN ER 30-600 MG PO TB12
1.0000 | ORAL_TABLET | Freq: Two times a day (BID) | ORAL | Status: DC
  Administered 2020-03-19 – 2020-03-22 (×7): 1 via ORAL
  Filled 2020-03-19 (×7): qty 1

## 2020-03-19 MED ORDER — HEPARIN SODIUM (PORCINE) 5000 UNIT/ML IJ SOLN
5000.0000 [IU] | Freq: Three times a day (TID) | INTRAMUSCULAR | Status: DC
  Administered 2020-03-19 – 2020-03-22 (×10): 5000 [IU] via SUBCUTANEOUS
  Filled 2020-03-19 (×10): qty 1

## 2020-03-19 MED ORDER — HYDRALAZINE HCL 20 MG/ML IJ SOLN
10.0000 mg | Freq: Once | INTRAMUSCULAR | Status: AC
  Administered 2020-03-19: 10 mg via INTRAVENOUS
  Filled 2020-03-19: qty 1

## 2020-03-19 MED ORDER — MELATONIN 3 MG PO TABS
3.0000 mg | ORAL_TABLET | Freq: Once | ORAL | Status: AC
  Administered 2020-03-19: 3 mg via ORAL
  Filled 2020-03-19: qty 1

## 2020-03-19 MED ORDER — SENNA 8.6 MG PO TABS: 1.0000 | ORAL_TABLET | Freq: Every day | ORAL | Status: DC | PRN

## 2020-03-19 MED ORDER — HYDRALAZINE HCL 25 MG PO TABS
100.0000 mg | ORAL_TABLET | Freq: Three times a day (TID) | ORAL | Status: DC
  Administered 2020-03-19 – 2020-03-22 (×10): 100 mg via ORAL
  Filled 2020-03-19 (×7): qty 4
  Filled 2020-03-19: qty 2
  Filled 2020-03-19 (×3): qty 4

## 2020-03-19 MED ORDER — ORAL CARE MOUTH RINSE
15.0000 mL | Freq: Two times a day (BID) | OROMUCOSAL | Status: DC
  Administered 2020-03-20 (×2): 15 mL via OROMUCOSAL

## 2020-03-19 MED ORDER — INSULIN ASPART 100 UNIT/ML ~~LOC~~ SOLN
0.0000 [IU] | Freq: Three times a day (TID) | SUBCUTANEOUS | Status: DC
  Administered 2020-03-19 (×3): 5 [IU] via SUBCUTANEOUS
  Administered 2020-03-20 (×3): 2 [IU] via SUBCUTANEOUS
  Administered 2020-03-21: 5 [IU] via SUBCUTANEOUS
  Administered 2020-03-21: 2 [IU] via SUBCUTANEOUS
  Administered 2020-03-21: 3 [IU] via SUBCUTANEOUS
  Administered 2020-03-22 (×2): 2 [IU] via SUBCUTANEOUS
  Filled 2020-03-19 (×2): qty 1

## 2020-03-19 MED ORDER — DOCUSATE SODIUM 100 MG PO CAPS
100.0000 mg | ORAL_CAPSULE | Freq: Every day | ORAL | Status: DC
  Administered 2020-03-19 – 2020-03-21 (×3): 100 mg via ORAL
  Filled 2020-03-19 (×4): qty 1

## 2020-03-19 MED ORDER — ACETAMINOPHEN 650 MG RE SUPP: 650.0000 mg | Freq: Four times a day (QID) | RECTAL | Status: DC | PRN

## 2020-03-19 MED ORDER — CARVEDILOL 12.5 MG PO TABS
25.0000 mg | ORAL_TABLET | Freq: Two times a day (BID) | ORAL | Status: DC
  Administered 2020-03-19 – 2020-03-22 (×7): 25 mg via ORAL
  Filled 2020-03-19 (×7): qty 2

## 2020-03-19 MED ORDER — ATORVASTATIN CALCIUM 40 MG PO TABS
80.0000 mg | ORAL_TABLET | Freq: Every day | ORAL | Status: DC
  Administered 2020-03-19 – 2020-03-21 (×3): 80 mg via ORAL
  Filled 2020-03-19 (×3): qty 2

## 2020-03-19 MED ORDER — IPRATROPIUM-ALBUTEROL 0.5-2.5 (3) MG/3ML IN SOLN
3.0000 mL | Freq: Once | RESPIRATORY_TRACT | Status: AC
  Administered 2020-03-19: 3 mL via RESPIRATORY_TRACT
  Filled 2020-03-19: qty 3

## 2020-03-19 MED ORDER — POLYETHYLENE GLYCOL 3350 17 G PO PACK
17.0000 g | PACK | Freq: Every day | ORAL | Status: DC
  Administered 2020-03-20 – 2020-03-21 (×2): 17 g via ORAL
  Filled 2020-03-19 (×4): qty 1

## 2020-03-19 MED ORDER — OXYCODONE HCL 5 MG PO TABS
5.0000 mg | ORAL_TABLET | ORAL | Status: DC | PRN
  Administered 2020-03-19 – 2020-03-21 (×6): 5 mg via ORAL
  Filled 2020-03-19 (×6): qty 1

## 2020-03-19 NOTE — Progress Notes (Signed)
Patient seen and evaluated, chart reviewed, please see EMR for updated orders. Please see full H&P dictated by admitting physician Dr. Josephine Cables for same date of service.    Brief Summary:- 54 y.o. female with medical history significant for hypertension, hyperlipidemia, CHF, COPD with chronic hypoxic respiratory failure requiring 3 L of oxygen, diabetes mellitus, CKD and anemia (type unknown; requires intermittent blood transfusion and monthly vitamin B12 injection admitted on 03/19/2020 with sepsis secondary to bilateral pneumonia with acute on chronic hypoxic respiratory failure   A/p  1) sepsis secondary to bilateral pneumonia with acute on chronic respiratory failure----continue Rocephin/azithromycin pending cultures -Continue supplemental oxygen nasally or via trach collar -On admission patient had leukocytosis with a white count of 10.9, tachypnea tachycardia and worsening hypoxia (at baseline usually on 3 L , required up to 5 L this admission)  2) bilateral ulcers/wounds of the toes--- please see photos in epic, wound care consult requested  3)HFpEFHTN--- patient with chronic diastolic dysfunction CHF, continue Imdur and Coreg.... Patient also on amlodipine for HTN  4)DM2--A1c is 8.8 reflecting uncontrolled DM,   5)HTN--okay to continue amlodipine 5 mg daily, Coreg 25 mg twice daily  6) elevated troponin--- Trop 64 >> 78... In the setting of respiratory failure with hypoxia suspect some degree of demand ischemia  7) chronic anemia--- multifactorial history of iron deficiency and B12 deficiency requiring both PRBC transfusions and B12 injections chronically--- hemoglobin currently 8.2 which is close to patient's baseline  8)CKD IIIb --- creatinine currently 1.64 which is close to patient's baseline -renally adjust medications, avoid nephrotoxic agents / dehydration  / hypotension   --Total care time over 43 minutes   Patient seen and evaluated, chart reviewed, please see EMR for  updated orders. Please see full H&P dictated by admitting physician Dr. Josephine Cables for same date of service

## 2020-03-19 NOTE — H&P (Signed)
History and Physical  [name] AVW:[id number] DOB: [birth year] DOA: 03/18/2020  Referring physician: Daleen Bo, MD PCP: Nanetta Batty, DO  Patient coming from: Home  Chief Complaint: Generalized weakness and abdominal pain  HPI: Joy Yoder is a 54 y.o. female with medical history significant for hypertension, hyperlipidemia, CHF, COPD with chronic hypoxic respiratory failure requiring 3 L of oxygen, diabetes mellitus, CKD and anemia (type unknown; requires intermittent blood transfusion and monthly vitamin B12 injection -follows with Dr. Junius Roads- [place], [place]) who presented to the ED due to worsening generalized weakness and abdominal pain.  Patient complained of worsening cough with production of brownish sputum, nasal and chest congestion that has been ongoing since Wednesday (8/11), she had to increase home oxygen to 4 LPM with increased breathing treatment frequency due to worsening shortness of breath.  She was waiting to follow-up with her PCP today (8/16), but symptoms got worse with increased weakness and tendency to pass out when ambulating, so she went to [facility], [place], [place] on Saturday (8/14) and was discharged from the ED yesterday, but patient states that her condition worsened and complained of abdominal pain, nausea and nonbloody vomiting x1, so she presented to this ED for further evaluation and management.  Patient lives alone at baseline was able to take care of ADLs, she also states that she has had due to doses of Pfizer Covid vaccines.  ED Course:  In the emergency department, she was febrile with a temperature of 102.7, tachypneic and BP was 217/76.  Work-up in the ED showed normocytic anemia, BUN/creatinine 23/1.71 (creatinine ranged within 1.8-2.1 as of 4 months ago) and hyperglycemia, albumin 3.2, Sarscoronavirus 2 was negative.  FOBT was negative.  Chest x-ray showed mild left basilar atelectasis and/or infiltrate, new when compared to the  prior study.  CT abdomen and pelvis with contrast showed no acute process in the abdomen or pelvis, but showed left greater than right base airspace disease, most consistent with infection or aspiration.  IV hydration per sepsis protocol was provided and patient was started on IV antibiotics (ceftriaxone and azithromycin).  Tylenol was given due to fever.  Clonidine was given due to elevated blood pressure.  Morphine and Zofran were given and hospitalist was asked to admit patient for further evaluation and management.  Review of Systems: Constitutional: Negative for chills and fever.  HENT: Negative for ear pain and sore throat.   Eyes: Negative for pain and visual disturbance.  Respiratory: Positive for cough, chest pain (related to cough) and shortness of breath.   Cardiovascular: Positive for  reproducible chest pain (related to cough).  No palpitations.  Gastrointestinal: Positive for abdominal pain, nausea and vomiting.  Endocrine: Negative for polyphagia and polyuria.  Genitourinary: Negative for decreased urine volume, dysuria Musculoskeletal: Negative for arthralgias and back pain.  Skin: Negative for color change and rash.  Allergic/Immunologic: Negative for immunocompromised state.  Neurological: Negative for tremors, syncope, speech difficulty, weakness, light-headedness and headaches.  Hematological: Does not bruise/bleed easily.  All other systems reviewed and are negative   Past Medical History:  Diagnosis Date  . Acute on chronic respiratory failure with hypoxia ([place])   . Anxiety   . CHF (congestive heart failure) ([place])   . Chronic diastolic heart failure ([place])   . Chronic kidney disease, stage III (moderate)   . COPD, severe ([place])   . DDD (degenerative disc disease)   . DDD (degenerative disc disease), thoracic    chest to back, legs,   . Diabetes  mellitus   . DJD (degenerative joint disease)   . Hypertension   . Kidney disease    stage 3   . Lobar pneumonia ([place])    . Neuropathy   . Stroke [facility])    Past Surgical History:  Procedure Laterality Date  . ABDOMINAL HYSTERECTOMY    . bladder tack    . CHOLECYSTECTOMY    . EYE SURGERY    . THYROID SURGERY      Social History:  reports that she has never smoked. She has never used smokeless tobacco. She reports that she does not drink alcohol and does not use drugs.   Allergies  Allergen Reactions  . Metformin And Related Diarrhea  . Nsaids     Kidney disease  . Pravastatin Swelling  . Reglan [Metoclopramide]     shakes  . Tramadol Itching  . Vancomycin Other (See Comments)    Kidney failure Kidney failure     History reviewed. No pertinent family history.   Prior to Admission medications   Medication Sig Start Date End Date Taking? Authorizing Provider  amitriptyline (ELAVIL) 25 MG tablet Take 50 mg by mouth at bedtime.    [provider]  amLODipine (NORVASC) 5 MG tablet Take 5 mg by mouth daily.     [provider]  apixaban (ELIQUIS) 5 MG TABS tablet Take 5 mg by mouth 2 (two) times daily.    [provider]  atorvastatin (LIPITOR) 80 MG tablet Take 80 mg by mouth daily at 6 PM.    [provider]  carvedilol (COREG) 25 MG tablet Take 25 mg by mouth 2 (two) times daily with a meal.     [provider]  clopidogrel (PLAVIX) 75 MG tablet Take 75 mg by mouth daily.    [provider]  cyclobenzaprine (FLEXERIL) 10 MG tablet Take 10 mg by mouth 3 (three) times daily as needed for muscle spasms.    [provider]  furosemide (LASIX) 20 MG tablet Take 20 mg by mouth in the morning.    [provider]  gabapentin (NEURONTIN) 600 MG tablet Take 600 mg by mouth 3 (three) times daily.     [provider]  hydrALAZINE (APRESOLINE) 100 MG tablet Take 100 mg by mouth 3 (three) times daily.     [provider]  HYDROcodone-acetaminophen (NORCO/VICODIN) 5-325 MG tablet Take 1 tablet by mouth every 6 (six) hours  as needed for moderate pain. 11/11/19   Manuella Ghazi, Pratik D, DO  hydrOXYzine (ATARAX/VISTARIL) 50 MG tablet Take 50 mg by mouth in the morning and at bedtime.     [provider]  insulin lispro (HUMALOG) 100 UNIT/ML injection Inject 5-16 Units into the skin 3 (three) times daily before meals. Patient injects according to sliding scale at home.    [provider]  isosorbide mononitrate (IMDUR) 30 MG 24 hr tablet Take 30 mg by mouth in the morning.     [provider]  LANTUS SOLOSTAR 100 UNIT/ML Solostar Pen Inject 50 Units into the skin at bedtime.  10/19/19   [provider]  pantoprazole (PROTONIX) 40 MG tablet Take 40 mg by mouth in the morning.    [provider]  venlafaxine XR (EFFEXOR-XR) 150 MG 24 hr capsule Take 150 mg by mouth every evening.     [provider]    Physical Exam: BP (!) 226/79   Pulse 86   Temp 98.4 F (36.9 C) (Oral)   Resp (!) 23  Ht 5' 2"  (1.575 m)   Wt 78.5 kg   SpO2 99%   BMI 31.64 kg/m   . General: 54 y.o. year-old female well developed well nourished in no acute distress.  Alert and oriented x3. Marland Kitchen HEENT: Normocephalic, atraumatic . Neck: Patient with tracheostomy and noted trach tube . Cardiovascular: Regular rate and rhythm with no rubs or gallops.  No thyromegaly or JVD noted.  No lower extremity edema. 2/4 pulses in all 4 extremities. Marland Kitchen Respiratory: Bilateral decreased breath sounds with mild diffuse wheezing on auscultation.  No accessory muscle use. . Abdomen: Soft nontender nondistended with normal bowel sounds x4 quadrants. . Muskuloskeletal: No cyanosis, clubbing or edema noted bilaterally . Neuro: CN II-XII intact, strength, sensation, reflexes . Skin: No ulcerative lesions noted or rashes . Psychiatry: Judgement and insight appear normal. Mood is appropriate for condition and setting          Labs on Admission:  Basic Metabolic Panel: Recent Labs  Lab 03/18/20 1253 03/18/20 2256  NA 137  137  K 4.2 4.4  CL 105 106  CO2 25 24  GLUCOSE 197* 240*  BUN 33* 30*  CREATININE 1.71* 1.64*  CALCIUM 8.3* 7.8*   Liver Function Tests: Recent Labs  Lab 03/18/20 1253  AST 18  ALT 19  ALKPHOS 77  BILITOT 0.4  PROT 8.3*  ALBUMIN 3.2*   Recent Labs  Lab 03/18/20 1253  LIPASE 29   No results for input(s): AMMONIA in the last 168 hours. CBC: Recent Labs  Lab 03/18/20 1253 03/18/20 2256  WBC 10.3 8.9  NEUTROABS 7.4 6.7  HGB 9.0* 7.9*  HCT 29.8* 26.1*  MCV 87.1 87.6  PLT 222 198   Cardiac Enzymes: No results for input(s): CKTOTAL, CKMB, CKMBINDEX, TROPONINI in the last 168 hours.  BNP (last 3 results) Recent Labs    11/09/19 2235  BNP 136.0*    ProBNP (last 3 results) No results for input(s): PROBNP in the last 8760 hours.  CBG: No results for input(s): GLUCAP in the last 168 hours.  Radiological Exams on Admission: CT ABDOMEN PELVIS W CONTRAST  Result Date: 03/18/2020 CLINICAL DATA:  Vomiting.  Weakness.  COPD.  Hysterectomy. EXAM: CT ABDOMEN AND PELVIS WITH CONTRAST TECHNIQUE: Multidetector CT imaging of the abdomen and pelvis was performed using the standard protocol following bolus administration of intravenous contrast. CONTRAST:  79m OMNIPAQUE IOHEXOL 300 MG/ML  SOLN COMPARISON:  07/05/2017 renal ultrasound. 07/04/2017 abdominopelvic CT. FINDINGS: Lower chest: Left greater than right base airspace disease. Mild cardiomegaly. Hepatobiliary: Minimal motion degradation throughout the abdomen. Normal liver. Cholecystectomy, without biliary ductal dilatation. Pancreas: Normal, without mass or ductal dilatation. Spleen: Normal in size, without focal abnormality. Adrenals/Urinary Tract: Normal adrenal glands. Normal kidneys, without hydronephrosis. Normal urinary bladder. Stomach/Bowel: Gastric antral underdistention. Colonic stool burden suggests constipation. Normal terminal ileum. Diminutive appendix. Normal small bowel. Vascular/Lymphatic: Aortic  atherosclerosis. 1.0 cm right inguinal node is unchanged. 1.0 cm left inguinal node is decreased from 1.5 cm on the prior. Reproductive: Hysterectomy.  No adnexal mass. Other: No significant free fluid. No free intraperitoneal air. Anterior abdominal wall battery pack for dorsal spinal stimulator, causing mild beam hardening artifact. 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 IMPRESSION: 1. No acute process in the abdomen or pelvis. 2. Possible constipation. 3. Left greater than right base airspace disease, most consistent with infection or aspiration. 4. Aortic Atherosclerosis (ICD10-I70.0). 5. 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 Electronically Signed   By: KAdria DevonD.  On: 03/18/2020 15:48   DG Chest Port 1 View  Result Date: 03/18/2020 CLINICAL DATA:  Fever. EXAM: PORTABLE CHEST 1 VIEW COMPARISON:  November 11, 2019 FINDINGS: There is stable tracheostomy tube positioning. Mild atelectasis and/or infiltrate is seen within the left lung base. This represents a new finding when compared to the prior study. There is no evidence of a pleural effusion or pneumothorax. The cardiac silhouette is borderline in size. The visualized skeletal structures are unremarkable. IMPRESSION: Mild left basilar atelectasis and/or infiltrate, new when compared to the prior study. Electronically Signed   By: Virgina Norfolk M.D.   On: 03/18/2020 21:54    EKG: I independently viewed the EKG done and my findings are as followed: Sinus rhythm at rate of 72 bpm with nonspecific ST abnormality  Assessment/Plan Present on Admission: . Aspiration pneumonia ([place]) . Hypertension . Acute on chronic respiratory failure with hypoxia ([place]) . Chronic kidney disease, stage III (moderate) . COPD, severe ([place]) . Hyperglycemia due to diabetes mellitus ([place]) . Atypical chest pain . Chronic diastolic heart failure ([facility])  Principal Problem:   Aspiration pneumonia  ([facility]) Active Problems:   Hypertension   Type 2 diabetes mellitus without complication ([facility])   Nausea & vomiting   Acute on chronic respiratory failure with hypoxia ([facility])   Chronic diastolic heart failure ([facility])   Chronic kidney disease, stage III (moderate)   COPD, severe ([facility])   Hyperglycemia due to diabetes mellitus ([place])   Atypical chest pain   Abdominal pain   Constipation   Sepsis ([place])  Acute on chronic respiratory failure with hypoxia possibly secondary to sepsis due to aspiration/CAP pneumonia POA with concomitant mild COPD exacerbation Patient uses 3 LPM of oxygen at baseline, but she has since increased this to 4 LPM due to shortness of breath. Patient was febrile and tachycardic (met SIRS criteria) and with pneumonia as source of infection Chest x-ray showed mild left basilar atelectasis and/or infiltrate, new when compared to the prior study.   CT abdomen and pelvis with contrast showed left greater than right base airspace disease, most consistent with infection or aspiration. PORT/PSI of 85 points  Indicating 0.9-2.8% mortality She was started on IV ceftriaxone and azithromycin, we shall continue with same at this time continue Mucinex,  incentive spirometry, flutter valve Continue DuoNebs as needed Blood culture, strep pneumo, urine Legionella and sputum culture pending  Atypical chest pain possibly due to cough in the setting of above Patient complains of chest pain but was related to recurrent and persistent cough She had similar presentation during last admission (4/7-4/9) Chest pain was reproducible Troponin pending IV morphine 2 mg x 1 was given, Continue Tylenol as needed for mild pain Continue oxycodone 5 mg every 4 hours as needed for moderate/severe pain  Abdominal pain, nausea and vomiting in the setting of constipation Patient complained of history of constipation, last bowel movement was on Friday (8/13) after use of laxative Continue MiraLAX, senna and  Colace  Hypertensive urgency IV hydralazine 10 mg x 1 will be given Continue amlodipine, Coreg, hydralazine and Imdur  Hyperglycemia secondary to type 2 diabetes mellitus Continue/sliding scale and hypoglycemia protocol  Chronic diastolic CHF Continue home meds  Chronic kidney disease stage III BUN/creatinine 23/1.71 (creatinine ranged within 1.8-2.1 as of 4 months ago)  Renally adjust medications, avoid nephrotoxic agents/dehydration/hypotension  GERD Continue Protonix  Hyperlipidemia Continue statin  DVT prophylaxis: SCD  Code Status: Full code  Family Communication: None at bedside  Disposition Plan:  Patient is from:  home Anticipated DC to:                   SNF or family members home Anticipated DC date:               2-3 days Anticipated DC barriers:          Unstable to discharge at this time due to acute on chronic respiratory failure in the setting of aspiration/CAP POA requiring IV antibiotics   Consults called: None  Admission status: Inpatient    Bernadette Hoit MD [facility]  If 7PM-7AM, please contact night-coverage [email or web address] Password [facility]  03/19/2020, 3:36 AM

## 2020-03-19 NOTE — ED Notes (Signed)
Suction performed by RT, oral care also provided by nursing. Pt tolerated well.

## 2020-03-19 NOTE — ED Notes (Signed)
Pt requesting trach to be suctioned. RT aware.

## 2020-03-19 NOTE — ED Notes (Signed)
Pt daughter notified of admission and room assignment. Pt daughter contact information [phone].

## 2020-03-20 ENCOUNTER — Inpatient Hospital Stay (HOSPITAL_COMMUNITY): Payer: 59

## 2020-03-20 DIAGNOSIS — J9621 Acute and chronic respiratory failure with hypoxia: Secondary | ICD-10-CM

## 2020-03-20 LAB — CBC
HCT: 27.2 % — ABNORMAL LOW (ref 36.0–46.0)
Hemoglobin: 8 g/dL — ABNORMAL LOW (ref 12.0–15.0)
MCH: 25.9 pg — ABNORMAL LOW (ref 26.0–34.0)
MCHC: 29.4 g/dL — ABNORMAL LOW (ref 30.0–36.0)
MCV: 88 fL (ref 80.0–100.0)
Platelets: 195 10*3/uL (ref 150–400)
RBC: 3.09 MIL/uL — ABNORMAL LOW (ref 3.87–5.11)
RDW: 16.5 % — ABNORMAL HIGH (ref 11.5–15.5)
WBC: 9.1 10*3/uL (ref 4.0–10.5)
nRBC: 0.2 % (ref 0.0–0.2)

## 2020-03-20 LAB — GLUCOSE, CAPILLARY
Glucose-Capillary: 173 mg/dL — ABNORMAL HIGH (ref 70–99)
Glucose-Capillary: 166 mg/dL — ABNORMAL HIGH (ref 70–99)
Glucose-Capillary: 195 mg/dL — ABNORMAL HIGH (ref 70–99)
Glucose-Capillary: 180 mg/dL — ABNORMAL HIGH (ref 70–99)
Glucose-Capillary: 171 mg/dL — ABNORMAL HIGH (ref 70–99)

## 2020-03-20 LAB — ECHOCARDIOGRAM COMPLETE
Area-P 1/2: 3.63 cm2
Height: 62 in
S' Lateral: 2.24 cm
Weight: 2768 oz

## 2020-03-20 LAB — COMPREHENSIVE METABOLIC PANEL
ALT: 14 U/L (ref 0–44)
AST: 15 U/L (ref 15–41)
Albumin: 2.3 g/dL — ABNORMAL LOW (ref 3.5–5.0)
Alkaline Phosphatase: 54 U/L (ref 38–126)
Anion gap: 8 (ref 5–15)
BUN: 42 mg/dL — ABNORMAL HIGH (ref 6–20)
CO2: 24 mmol/L (ref 22–32)
Calcium: 8.2 mg/dL — ABNORMAL LOW (ref 8.9–10.3)
Chloride: 105 mmol/L (ref 98–111)
Creatinine, Ser: 2.03 mg/dL — ABNORMAL HIGH (ref 0.44–1.00)
GFR calc Af Amer: 31 mL/min — ABNORMAL LOW (ref >60)
GFR calc non Af Amer: 27 mL/min — ABNORMAL LOW (ref >60)
Glucose, Bld: 177 mg/dL — ABNORMAL HIGH (ref 70–99)
Potassium: 4.3 mmol/L (ref 3.5–5.1)
Sodium: 137 mmol/L (ref 135–145)
Total Bilirubin: 0.3 mg/dL (ref 0.3–1.2)
Total Protein: 6.5 g/dL (ref 6.5–8.1)

## 2020-03-20 MED ORDER — HYDROXYZINE HCL 25 MG PO TABS
25.0000 mg | ORAL_TABLET | Freq: Three times a day (TID) | ORAL | Status: DC | PRN
  Administered 2020-03-20 – 2020-03-22 (×6): 25 mg via ORAL
  Filled 2020-03-20 (×6): qty 1

## 2020-03-20 MED ORDER — LABETALOL HCL 5 MG/ML IV SOLN
10.0000 mg | INTRAVENOUS | Status: DC | PRN
  Administered 2020-03-21: 10 mg via INTRAVENOUS
  Filled 2020-03-20: qty 4

## 2020-03-20 NOTE — Progress Notes (Signed)
*  PRELIMINARY RESULTS* Echocardiogram 2D Echocardiogram has been performed.  Joy Yoder 03/20/2020, 1:57 PM

## 2020-03-20 NOTE — Progress Notes (Signed)
Patient Demographics:    [place] Yoder, is a 54 y.o. female, DOB - 1965/10/01, EQA:[id number]  Admit date - 03/18/2020   Admitting Physician Bernadette Hoit, DO  Outpatient Primary MD for the patient is [name], [name], DO  LOS - 1   Chief Complaint  Patient presents with  . Emesis        Subjective:    [name] today has no fevers, no emesis,  No chest pain,   --cough , hypoxia and shob persist   Assessment  & Plan :    Principal Problem:   Aspiration pneumonia ([place]) Active Problems:   Hypertension   Type 2 diabetes mellitus without complication (HCC)   Nausea & vomiting   Acute on chronic respiratory failure with hypoxia (HCC)   Chronic diastolic heart failure (HCC)   Chronic kidney disease, stage III (moderate)   COPD, severe (HCC)   Hyperglycemia due to diabetes mellitus ([place])   Atypical chest pain   Abdominal pain   Constipation   Sepsis ([place])   Brief Summary:- 54 y.o.femalewith medical history significant forhypertension, hyperlipidemia, CHF, COPD with chronic hypoxic respiratory failure requiring 3 L of oxygen, diabetes mellitus,CKDand anemia (type unknown; requires intermittent blood transfusion and monthly vitamin B12 injection admitted on 03/19/2020 with sepsis secondary to bilateral pneumonia with acute on chronic hypoxic respiratory failure   A/p  1)Sepsis secondary to bilateral Pneumonia with acute on chronic respiratory failure----continue Rocephin/azithromycin pending cultures -Continue supplemental oxygen nasally or via trach collar -On admission patient had leukocytosis with a white count of 10.9, tachypnea tachycardia and worsening hypoxia (at baseline usually on 3 L , required up to 5 L this admission) WBC 10.9 >>9.1 --Repeat chest x-ray on 03/21/2020  2)Bilateral Ulcers/wounds of the toes--- please see photos in epic, wound care consult  requested  3)HFpEF/HTN--- patient with chronic diastolic dysfunction CHF, continue Imdur and Coreg.... Patient also on amlodipine for HTN -Echo with preserved EF of 65 to 70%  4)DM2--A1c is 8.8 reflecting uncontrolled DM, Use Novolog/Humalog Sliding scale insulin with Accu-Cheks/Fingersticks as ordered   5)HTN--c/n Amlodipine 5 mg daily, Coreg 25 mg twice daily  6)Elevated Troponin--- Trop 64 >> 78... In the setting of respiratory failure with hypoxia suspect some degree of demand ischemia  7)Chronic Anemia--- multifactorial history of iron deficiency and B12 deficiency requiring both PRBC transfusions and B12 injections chronically--- hemoglobin currently 8.0 which is close to patient's baseline  8)CKD IIIb --- creatinine is 2.0 up from 1.64 which is close to patient's baseline -renally adjust medications, avoid nephrotoxic agents / dehydration  / hypotension  9) decubitus ulcers of toes--- wound care consult requested,.  See photos in epic  10) generalized weakness and deconditioning--- PT eval requested  Disposition/Need for in-Hospital Stay- patient unable to be discharged at this time due to --pneumonia with sepsis requiring IV antibiotics, worsening hypoxia requiring increased oxygen supplementation*  Status is: Inpatient  Remains inpatient appropriate because: pneumonia with sepsis requiring IV antibiotics, worsening hypoxia requiring increased oxygen supplementation*   Disposition: The patient is from: Home              Anticipated d/c is to: Home              Anticipated d/c date is: 2 days  Patient currently is not medically stable to d/c. Barriers: Not Clinically Stable- pneumonia with sepsis requiring IV antibiotics, worsening hypoxia requiring increased oxygen supplementation*  Code Status : full  Family Communication:   (patient is alert, awake and coherent)   Consults  :  na  DVT Prophylaxis  :    - Heparin -    Lab Results  Component Value  Date   PLT 195 03/20/2020    Inpatient Medications  Scheduled Meds: . amLODipine  5 mg Oral Daily  . atorvastatin  80 mg Oral q1800  . carvedilol  25 mg Oral BID WC  . chlorhexidine  15 mL Mouth Rinse BID  . clopidogrel  75 mg Oral Daily  . dextromethorphan-guaiFENesin  1 tablet Oral BID  . docusate sodium  100 mg Oral Daily  . heparin  5,000 Units Subcutaneous Q8H  . hydrALAZINE  100 mg Oral TID  . insulin aspart  0-5 Units Subcutaneous QHS  . insulin aspart  0-9 Units Subcutaneous TID WC  . isosorbide mononitrate  30 mg Oral q AM  . lidocaine  1 patch Transdermal Q24H  . mouth rinse  15 mL Mouth Rinse q12n4p  . pantoprazole  40 mg Oral q AM  . polyethylene glycol  17 g Oral Daily   Continuous Infusions: . azithromycin 500 mg (03/19/20 2309)  . cefTRIAXone (ROCEPHIN)  IV 2 g (03/19/20 2308)   PRN Meds:.acetaminophen **OR** acetaminophen, hydrOXYzine, ipratropium-albuterol, labetalol, ondansetron (ZOFRAN) IV, oxyCODONE, senna    Anti-infectives (From admission, onward)   Start     Dose/Rate Route Frequency Ordered Stop   03/18/20 2145  cefTRIAXone (ROCEPHIN) 2 g in sodium chloride 0.9 % 100 mL IVPB     Discontinue     2 g 200 mL/hr over 30 Minutes Intravenous Every 24 hours 03/18/20 2138     03/18/20 2145  azithromycin (ZITHROMAX) 500 mg in sodium chloride 0.9 % 250 mL IVPB     Discontinue     500 mg 250 mL/hr over 60 Minutes Intravenous Every 24 hours 03/18/20 2138          Objective:   Vitals:   03/20/20 1223 03/20/20 1538 03/20/20 2025 03/20/20 2026  BP: (!) 136/59 (!) 143/62    Pulse: 61 (!) 58  78  Resp:    20  Temp:  98.4 F (36.9 C)    TempSrc:  Oral    SpO2:  98% 96% 96%  Weight:      Height:        Wt Readings from Last 3 Encounters:  03/18/20 78.5 kg  11/14/19 82.2 kg  11/11/19 81.9 kg    Intake/Output Summary (Last 24 hours) at 03/20/2020 2043 Last data filed at 03/20/2020 0650 Gross per 24 hour  Intake 470 ml  Output 500 ml  Net -30 ml    Physical Exam Gen:- Awake Alert,  In no apparent distress  HEENT:-Trache with trach collar Lungs-diminished breath sounds, few scattered rhonchi, no wheezing  CV- S1, S2 normal, regular  Abd-  +ve B.Sounds, Abd Soft, No tenderness,    Extremity/Skin:-   pedal pulses present --Toe ulcers Psych-affect is appropriate, oriented x3 Neuro-generalized weakness and deconditioning no new focal deficits, no tremors   Data Review:   Micro Results Recent Results (from the past 240 hour(s))  SARS Coronavirus 2 by RT PCR (hospital order, performed in [facility] hospital lab) Nasopharyngeal Nasopharyngeal Swab     Status: None   Collection Time: 03/18/20  9:45 PM  Specimen: Nasopharyngeal Swab  Result Value Ref Range Status   SARS Coronavirus 2 NEGATIVE NEGATIVE Final    Comment: (NOTE) SARS-CoV-2 target nucleic acids are NOT DETECTED.  The SARS-CoV-2 RNA is generally detectable in upper and lower respiratory specimens during the acute phase of infection. The lowest concentration of SARS-CoV-2 viral copies this assay can detect is 250 copies / mL. A negative result does not preclude SARS-CoV-2 infection and should not be used as the sole basis for treatment or other patient management decisions.  A negative result may occur with improper specimen collection / handling, submission of specimen other than nasopharyngeal swab, presence of viral mutation(s) within the areas targeted by this assay, and inadequate number of viral copies (<250 copies / mL). A negative result must be combined with clinical observations, patient history, and epidemiological information.  Fact Sheet for Patients:   StrictlyIdeas.no  Fact Sheet for Healthcare Providers: BankingDealers.co.za  This test is not yet approved or  cleared by the [place] FDA and has been authorized for detection and/or diagnosis of SARS-CoV-2 by FDA under an Emergency Use  Authorization (EUA).  This EUA will remain in effect (meaning this test can be used) for the duration of the COVID-19 declaration under Section 564(b)(1) of the Act, 21 U.S.C. section 360bbb-3(b)(1), unless the authorization is terminated or revoked sooner.  Performed at [facility], [address], [place], [place] [address]   Culture, blood (routine x 2)     Status: None (Preliminary result)   Collection Time: 03/18/20  9:46 PM   Specimen: BLOOD RIGHT FOREARM  Result Value Ref Range Status   Specimen Description BLOOD RIGHT FOREARM  Final   Special Requests   Final    BOTTLES DRAWN AEROBIC AND ANAEROBIC Blood Culture adequate volume   Culture   Final    NO GROWTH < 24 HOURS Performed at [facility], [address] [place], [place] [address]    Report Status PENDING  Incomplete  Culture, blood (routine x 2)     Status: None (Preliminary result)   Collection Time: 03/18/20 10:56 PM   Specimen: BLOOD LEFT HAND  Result Value Ref Range Status   Specimen Description BLOOD LEFT HAND  Final   Special Requests NONE  Final   Culture   Final    NO GROWTH < 12 HOURS Performed at [facility], [address], [place], [place] [address]    Report Status PENDING  Incomplete    Radiology Reports CT ABDOMEN PELVIS W CONTRAST  Result Date: 03/18/2020 CLINICAL DATA:  Vomiting.  Weakness.  COPD.  Hysterectomy. EXAM: CT ABDOMEN AND PELVIS WITH CONTRAST TECHNIQUE: Multidetector CT imaging of the abdomen and pelvis was performed using the standard protocol following bolus administration of intravenous contrast. CONTRAST:  50mL OMNIPAQUE IOHEXOL 300 MG/ML  SOLN COMPARISON:  07/05/2017 renal ultrasound. 07/04/2017 abdominopelvic CT. FINDINGS: Lower chest: Left greater than right base airspace disease. Mild cardiomegaly. Hepatobiliary: Minimal motion degradation throughout the abdomen. Normal liver. Cholecystectomy, without biliary ductal dilatation. Pancreas: Normal, without mass or ductal dilatation.  Spleen: Normal in size, without focal abnormality. Adrenals/Urinary Tract: Normal adrenal glands. Normal kidneys, without hydronephrosis. Normal urinary bladder. Stomach/Bowel: Gastric antral underdistention. Colonic stool burden suggests constipation. Normal terminal ileum. Diminutive appendix. Normal small bowel. Vascular/Lymphatic: Aortic atherosclerosis. 1.0 cm right inguinal node is unchanged. 1.0 cm left inguinal node is decreased from 1.5 cm on the prior. Reproductive: Hysterectomy.  No adnexal mass. Other: No significant free fluid. No free intraperitoneal air. Anterior abdominal wall battery  pack for dorsal spinal stimulator, causing mild beam hardening artifact. 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 IMPRESSION: 1. No acute process in the abdomen or pelvis. 2. Possible constipation. 3. Left greater than right base airspace disease, most consistent with infection or aspiration. 4. Aortic Atherosclerosis (ICD10-I70.0). 5. 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 Electronically Signed   By: Abigail Miyamoto M.D.   On: 03/18/2020 15:48   DG Chest Port 1 View  Result Date: 03/18/2020 CLINICAL DATA:  Fever. EXAM: PORTABLE CHEST 1 VIEW COMPARISON:  November 11, 2019 FINDINGS: There is stable tracheostomy tube positioning. Mild atelectasis and/or infiltrate is seen within the left lung base. This represents a new finding when compared to the prior study. There is no evidence of a pleural effusion or pneumothorax. The cardiac silhouette is borderline in size. The visualized skeletal structures are unremarkable. IMPRESSION: Mild left basilar atelectasis and/or infiltrate, new when compared to the prior study. Electronically Signed   By: Virgina Norfolk M.D.   On: 03/18/2020 21:54   ECHOCARDIOGRAM COMPLETE  Result Date: 03/20/2020    ECHOCARDIOGRAM REPORT   Patient Name:   ALYSEN SMYLIE Leece Date of Exam: 03/20/2020 Medical Rec #:  [id number]                Height:       62.0 in Accession #:    [id number]              Weight:       173.0 lb Date of Birth:  [birth year]               BSA:          1.797 m Patient Age:    73 years                BP:           136/59 mmHg Patient Gender: F                       HR:           61 bpm. Exam Location:  [facility] Procedure: 2D Echo Indications:    Dyspnea 786.09 / R06.00  History:        Patient has no prior history of Echocardiogram examinations.                 COPD, Signs/Symptoms:Chest Pain; Risk Factors:Diabetes and                 Hypertension. Sepsis.  Sonographer:    Leavy Cella RDCS (AE) Referring Phys: [id number] Reniyah Gootee IMPRESSIONS  1. Left ventricular ejection fraction, by estimation, is 65 to 70%. The left ventricle has normal function. The left ventricle has no regional wall motion abnormalities. There is moderate left ventricular hypertrophy. Left ventricular diastolic parameters are indeterminate.  2. Right ventricular systolic function is normal. The right ventricular size is normal. Tricuspid regurgitation signal is inadequate for assessing PA pressure.  3. The mitral valve is grossly normal. Trivial mitral valve regurgitation.  4. The aortic valve is tricuspid. Aortic valve regurgitation is not visualized.  5. The inferior vena cava is normal in size with greater than 50% respiratory variability, suggesting right atrial pressure of 3 mmHg. FINDINGS  Left Ventricle: Left ventricular ejection fraction, by estimation, is 65 to 70%. The left ventricle has normal function. The left ventricle has no regional wall motion abnormalities. The left ventricular internal cavity size was normal  in size. There is  moderate left ventricular hypertrophy. Left ventricular diastolic parameters are indeterminate. Right Ventricle: The right ventricular size is normal. No increase in right ventricular wall thickness. Right ventricular systolic function is normal. Tricuspid regurgitation signal is inadequate for  assessing PA pressure. Left Atrium: Left atrial size was normal in size. Right Atrium: Right atrial size was normal in size. Pericardium: There is no evidence of pericardial effusion. Presence of pericardial fat pad. Mitral Valve: The mitral valve is grossly normal. Trivial mitral valve regurgitation. Tricuspid Valve: The tricuspid valve is grossly normal. Tricuspid valve regurgitation is trivial. Aortic Valve: The aortic valve is tricuspid. Aortic valve regurgitation is not visualized. Mild aortic valve annular calcification. Pulmonic Valve: The pulmonic valve was grossly normal. Pulmonic valve regurgitation is trivial. Aorta: The aortic root is normal in size and structure. Venous: The inferior vena cava is normal in size with greater than 50% respiratory variability, suggesting right atrial pressure of 3 mmHg. IAS/Shunts: No atrial level shunt detected by color flow Doppler.  LEFT VENTRICLE PLAX 2D LVIDd:         3.85 cm  Diastology LVIDs:         2.24 cm  LV e' lateral:   7.72 cm/s LV PW:         1.38 cm  LV E/e' lateral: 11.0 LV IVS:        1.66 cm  LV e' medial:    6.20 cm/s LVOT diam:     2.00 cm  LV E/e' medial:  13.8 LVOT Area:     3.14 cm  RIGHT VENTRICLE RV S prime:     8.27 cm/s TAPSE (M-mode): 2.0 cm LEFT ATRIUM             Index       RIGHT ATRIUM           Index LA diam:        4.20 cm 2.34 cm/m  RA Area:     10.10 cm LA Vol (A2C):   30.4 ml 16.91 ml/m RA Volume:   22.10 ml  12.30 ml/m LA Vol (A4C):   31.7 ml 17.64 ml/m LA Biplane Vol: 32.4 ml 18.03 ml/m   AORTA Ao Root diam: 2.70 cm MITRAL VALVE MV Area (PHT): 3.63 cm    SHUNTS MV Decel Time: 209 msec    Systemic Diam: 2.00 cm MV E velocity: 85.30 cm/s MV A velocity: 46.70 cm/s MV E/A ratio:  1.83 Rozann Lesches MD Electronically signed by Rozann Lesches MD Signature Date/Time: 03/20/2020/3:37:58 PM    Final      CBC Recent Labs  Lab 03/18/20 1253 03/18/20 2256 03/19/20 0401 03/20/20 0612  WBC 10.3 8.9 10.9* 9.1  HGB 9.0* 7.9*  8.2* 8.0*  HCT 29.8* 26.1* 28.1* 27.2*  PLT 222 198 193 195  MCV 87.1 87.6 87.8 88.0  MCH 26.3 26.5 25.6* 25.9*  MCHC 30.2 30.3 29.2* 29.4*  RDW 16.0* 16.0* 16.1* 16.5*  LYMPHSABS 1.4 1.1  --   --   MONOABS 1.4* 1.0  --   --   EOSABS 0.0 0.0  --   --   BASOSABS 0.0 0.0  --   --     Chemistries  Recent Labs  Lab 03/18/20 1253 03/18/20 2256 03/19/20 0401 03/20/20 0612  NA 137 137  --  137  K 4.2 4.4  --  4.3  CL 105 106  --  105  CO2 25 24  --  24  GLUCOSE 197* 240*  --  177*  BUN 33* 30*  --  42*  CREATININE 1.71* 1.64*  --  2.03*  CALCIUM 8.3* 7.8*  --  8.2*  MG  --   --  1.3*  --   AST 18  --   --  15  ALT 19  --   --  14  ALKPHOS 77  --   --  54  BILITOT 0.4  --   --  0.3   ------------------------------------------------------------------------------------------------------------------ No results for input(s): CHOL, HDL, LDLCALC, TRIG, CHOLHDL, LDLDIRECT in the last 72 hours.  Lab Results  Component Value Date   HGBA1C 8.8 (H) 03/19/2020   ------------------------------------------------------------------------------------------------------------------ No results for input(s): TSH, T4TOTAL, T3FREE, THYROIDAB in the last 72 hours.  Invalid input(s): FREET3 ------------------------------------------------------------------------------------------------------------------ No results for input(s): VITAMINB12, FOLATE, FERRITIN, TIBC, IRON, RETICCTPCT in the last 72 hours.  Coagulation profile Recent Labs  Lab 03/19/20 0401  INR 1.3*    No results for input(s): DDIMER in the last 72 hours.  Cardiac Enzymes No results for input(s): CKMB, TROPONINI, MYOGLOBIN in the last 168 hours.  Invalid input(s): CK ------------------------------------------------------------------------------------------------------------------    Component Value Date/Time   BNP 136.0 (H) 11/09/2019 0737     Roxan Hockey M.D on 03/20/2020 at 8:43 PM  Go to [email or web address] - for  contact info  [facility] - Office  [phone]

## 2020-03-21 ENCOUNTER — Inpatient Hospital Stay (HOSPITAL_COMMUNITY): Payer: 59

## 2020-03-21 DIAGNOSIS — J189 Pneumonia, unspecified organism: Secondary | ICD-10-CM

## 2020-03-21 LAB — GLUCOSE, CAPILLARY
Glucose-Capillary: 160 mg/dL — ABNORMAL HIGH (ref 70–99)
Glucose-Capillary: 255 mg/dL — ABNORMAL HIGH (ref 70–99)
Glucose-Capillary: 224 mg/dL — ABNORMAL HIGH (ref 70–99)
Glucose-Capillary: 183 mg/dL — ABNORMAL HIGH (ref 70–99)

## 2020-03-21 MED ORDER — LABETALOL HCL 5 MG/ML IV SOLN: 10.0000 mg | INTRAVENOUS | Status: DC | PRN

## 2020-03-21 MED ORDER — IPRATROPIUM-ALBUTEROL 0.5-2.5 (3) MG/3ML IN SOLN
3.0000 mL | Freq: Three times a day (TID) | RESPIRATORY_TRACT | Status: DC
  Administered 2020-03-22: 3 mL via RESPIRATORY_TRACT
  Filled 2020-03-21: qty 3

## 2020-03-21 MED ORDER — MELATONIN 3 MG PO TABS
6.0000 mg | ORAL_TABLET | Freq: Every evening | ORAL | Status: DC | PRN
  Administered 2020-03-21: 6 mg via ORAL
  Filled 2020-03-21: qty 2

## 2020-03-21 MED ORDER — AMOXICILLIN-POT CLAVULANATE 500-125 MG PO TABS
1.0000 | ORAL_TABLET | Freq: Two times a day (BID) | ORAL | Status: DC
  Administered 2020-03-21 – 2020-03-22 (×2): 500 mg via ORAL
  Filled 2020-03-21 (×2): qty 1

## 2020-03-21 MED ORDER — DIPHENHYDRAMINE HCL 50 MG/ML IJ SOLN
25.0000 mg | Freq: Once | INTRAMUSCULAR | Status: AC
  Administered 2020-03-22: 25 mg via INTRAVENOUS
  Filled 2020-03-21: qty 1

## 2020-03-21 MED ORDER — AMLODIPINE BESYLATE 5 MG PO TABS
7.5000 mg | ORAL_TABLET | Freq: Every day | ORAL | Status: DC
  Administered 2020-03-22: 7.5 mg via ORAL
  Filled 2020-03-21: qty 2

## 2020-03-21 NOTE — Progress Notes (Addendum)
Pt reported chest tightness and SOB. MD was notified. O2 sat 100%, no increased wob, vitals wnl for patient. RT was notified for PRN neb. Pt also displaying increased anxiety, moaning, and crying.

## 2020-03-21 NOTE — Care Management Important Message (Signed)
Important Message  Patient Details  Name: [place] Yoder MRN: [id number] Date of Birth: [birth year]   Medicare Important Message Given:  Yes     Tommy Medal 03/21/2020, 4:14 PM

## 2020-03-21 NOTE — Plan of Care (Addendum)
  Problem: Acute Rehab PT Goals(only PT should resolve) Goal: Pt Will Go Supine/Side To Sit Outcome: Progressing Flowsheets (Taken 03/21/2020 0850) Pt will go Supine/Side to Sit: Independently Goal: Patient Will Transfer Sit To/From Stand Outcome: Progressing Flowsheets (Taken 03/21/2020 0850) Patient will transfer sit to/from stand: with modified independence Goal: Pt Will Transfer Bed To Chair/Chair To Bed Outcome: Progressing Flowsheets (Taken 03/21/2020 0850) Pt will Transfer Bed to Chair/Chair to Bed: with modified independence Goal: Pt Will Ambulate Outcome: Progressing Flowsheets (Taken 03/21/2020 0850) Pt will Ambulate: . 100 feet . with rolling walker . with modified independence . with supervision    8:50 AM , 03/21/20 Karlyn Agee, SPT Physical Therapy with [facility] [phone] office    8:58 AM, 03/21/20 Mearl Latin PT, DPT Physical Therapist at [facility]

## 2020-03-21 NOTE — TOC Initial Note (Signed)
Transition of Care [facility]) - Initial/Assessment Note    Patient Details  Name: [place] Yoder MRN: [id number] Date of Birth: 1966-02-21  [facility]) CM/SW Contact:    Shade Flood, LCSW Phone Number: 03/21/2020, 10:56 AM  Clinical Narrative:                  Pt admitted from home. She lives alone in [place]. PT recommending HH PT at dc. Pt has a trach and is on 4L O2 at home. Pt has insurance and a PCP. DC timeframe not yet known. TOC will follow and further assist with dc planning as needed.  Expected Discharge Plan: [place] Barriers to Discharge: Continued Medical Work up   Patient Goals and CMS Choice        Expected Discharge Plan and Services Expected Discharge Plan: [place] In-house Referral: Clinical Social Work                                            Prior Living Arrangements/Services   Lives with:: Self Patient language and need for interpreter reviewed:: Yes Do you feel safe going back to the place where you live?: Yes      Need for Family Participation in Patient Care: No (Comment) Care giver support system in place?: No (comment) Current home services: DME Criminal Activity/Legal Involvement Pertinent to Current Situation/Hospitalization: No - Comment as needed  Activities of Daily Living   ADL Screening (condition at time of admission) Patient's cognitive ability adequate to safely complete daily activities?: Yes Is the patient deaf or have difficulty hearing?: No Does the patient have difficulty seeing, even when wearing glasses/contacts?: No Does the patient have difficulty concentrating, remembering, or making decisions?: No Patient able to express need for assistance with ADLs?: Yes Does the patient have difficulty dressing or bathing?: No Independently performs ADLs?: Yes (appropriate for developmental age) Does the patient have difficulty walking or climbing stairs?:  Yes Weakness of Legs: Both Weakness of Arms/Hands: Both  Permission Sought/Granted                  Emotional Assessment       Orientation: : Oriented to Self, Oriented to Place, Oriented to  Time, Oriented to Situation Alcohol / Substance Use: Not Applicable Psych Involvement: No (comment)  Admission diagnosis:  Aspiration pneumonia ([place]) [J69.0] Generalized abdominal pain [R10.84] Community acquired pneumonia of left lower lobe of lung [J18.9] Hypertension, unspecified type [I10] Patient Active Problem List   Diagnosis Date Noted  . Aspiration pneumonia ([place]) 03/19/2020  . Abdominal pain 03/19/2020  . Constipation 03/19/2020  . Sepsis ([place]) 03/19/2020  . Symptomatic anemia 11/10/2019  . Hyperglycemia due to diabetes mellitus ([place]) 11/10/2019  . Atypical chest pain 11/10/2019  . Acute on chronic respiratory failure with hypoxia ([place])   . Chronic diastolic heart failure ([place])   . Chronic kidney disease, stage III (moderate)   . COPD, severe ([place])   . Lobar pneumonia ([place])   . AKI (acute kidney injury) ([place]) 07/07/2017  . Chronic pain syndrome 07/07/2017  . Nausea & vomiting 07/06/2017  . Intractable pain 07/04/2017  . Hypertension 07/04/2017  . Type 2 diabetes mellitus without complication ([place]) 56/25/6389   PCP:  Nanetta Batty, DO Pharmacy:   [id number] - [place], [place]  [address] [place] [address] Phone: [phone] Fax: [phone]  [facility] [address] [place] [address] Phone: [phone] Fax: [phone]  [place], [place] [address] Phone: [phone] Fax: [place], [address] [place] [address] Phone: [phone] Fax: [phone]     Social Determinants of Health (SDOH)  Interventions    Readmission Risk Interventions Readmission Risk Prevention Plan 03/21/2020  Transportation Screening Complete  Medication Review Press photographer) Complete  SW Recovery Care/Counseling Consult Complete  Some recent data might be hidden

## 2020-03-21 NOTE — Evaluation (Addendum)
Physical Therapy Evaluation Patient Details Name: Joy Yoder MRN: [id number] DOB: [birth year] Today's Date: 03/21/2020   History of Present Illness  Joy Yoder is a 54 y.o. female with medical history significant for hypertension, hyperlipidemia, CHF, COPD with chronic hypoxic respiratory failure requiring 3 L of oxygen, diabetes mellitus, CKD and anemia (type unknown; requires intermittent blood transfusion and monthly vitamin B12 injection -follows with Dr. Junius Roads- [place], [place]) who presented to the ED due to worsening generalized weakness and abdominal pain.  Patient complained of worsening cough with production of brownish sputum, nasal and chest congestion that has been ongoing since Wednesday (8/11), she had to increase home oxygen to 4 LPM with increased breathing treatment frequency due to worsening shortness of breath.  She was waiting to follow-up with her PCP today (8/16), but symptoms got worse with increased weakness and tendency to pass out when ambulating, so she went to [facility], [place], [place] on Saturday (8/14) and was discharged from the ED yesterday, but patient states that her condition worsened and complained of abdominal pain, nausea and nonbloody vomiting x1, so she presented to this ED for further evaluation and management.  Patient lives alone at baseline was able to take care of ADLs, she also states that she has had due to doses of Pfizer Covid vaccines.    Clinical Impression  The patient's O2 sat was 92% on room air, 99% on 4L nasal cannula at rest, and 96% 4L nasal cannula during ambulation. The patient had onset of lightheadedness during supine to sit transfer, which improved after 2 minutes. The patients was able to perform sit to stand transfer with no LOB or increase in dizziness and proceeded to ambulate with RW for 20 feet. Patient tolerated sitting up in chair with call bell in arm's reach after therapy. PLAN: The patient will continue to  benefit from skilled physical therapy services in hospital at recommended venue below in order to improve balance, gait, and ADL's to promote independence in functional activities.         Home health PT;Supervision - Intermittent;Supervision for mobility/OOB    Equipment Recommendations    None recommended by PT.    Recommendations for Other Services   None recommended by PT.     Precautions / Restrictions Precautions Precautions: Fall Restrictions Weight Bearing Restrictions: No      Mobility  Bed Mobility Overal bed mobility: Modified Independent             General bed mobility comments: slow labored movement, used bed rails  Transfers Overall transfer level: Needs assistance Equipment used: Rolling walker (2 wheeled) Transfers: Sit to/from Stand Sit to Stand: Min guard         General transfer comment: light headedness and dizziness from supine to sit  Ambulation/Gait Ambulation/Gait assistance: Min guard Gait Distance (Feet): 20 Feet Assistive device: Rolling walker (2 wheeled) Gait Pattern/deviations: Step-through pattern;Decreased stride length     General Gait Details: slow labored cadence w short steps  Stairs            Wheelchair Mobility    Modified Rankin (Stroke Patients Only)       Balance Overall balance assessment: Needs assistance Sitting-balance support: No upper extremity supported;Feet supported Sitting balance-Leahy Scale: Good Sitting balance - Comments: dizziness upon sitting/upright position     Standing balance-Leahy Scale: Good Standing balance comment: leaning into RW  Pertinent Vitals/Pain Pain Assessment: No/denies pain (received pain meds before session)    Home Living Family/patient expects to be discharged to:: Private residence Living Arrangements: Alone Available Help at Discharge: Family;Personal care attendant (personal aid 9am-3pm care; daughter and  granddaughter prn) Type of Home: Apartment Home Access: Ramped entrance     Home Layout: One level Home Equipment: Toilet riser;Cane - single point;Walker - 4 wheels;Shower seat;Wheelchair - power      Prior Function Level of Independence: Needs assistance   Gait / Transfers Assistance Needed: uses [phone]  ADL's / Homemaking Assistance Needed: personal aid for bathing and dressing  Comments: family help w shopping, ADL, IADL's prn     Hand Dominance   Dominant Hand: Right    Extremity/Trunk Assessment   Upper Extremity Assessment Upper Extremity Assessment: Overall WFL for tasks assessed    Lower Extremity Assessment Lower Extremity Assessment: Overall WFL for tasks assessed    Cervical / Trunk Assessment Cervical / Trunk Assessment: Normal  Communication   Communication: No difficulties  Cognition Arousal/Alertness: Awake/alert Behavior During Therapy: WFL for tasks assessed/performed Overall Cognitive Status: Within Functional Limits for tasks assessed                                        General Comments      Exercises     Assessment/Plan    PT Assessment Patient needs continued PT services  PT Problem List Decreased strength;Decreased range of motion;Decreased activity tolerance;Decreased balance;Decreased mobility;Cardiopulmonary status limiting activity;Pain       PT Treatment Interventions DME instruction;Balance training;Gait training;Functional mobility training;Therapeutic activities;Therapeutic exercise;Patient/family education    PT Goals (Current goals can be found in the Care Plan section)  Acute Rehab PT Goals Patient Stated Goal: go home w assist PT Goal Formulation: With patient Time For Goal Achievement: 03/28/20 Potential to Achieve Goals: Good    Frequency Min 3X/week   Barriers to discharge        Co-evaluation               AM-PAC PT "6 Clicks" Mobility  Outcome Measure Help needed turning from your  back to your side while in a flat bed without using bedrails?: None Help needed moving from lying on your back to sitting on the side of a flat bed without using bedrails?: A Little Help needed moving to and from a bed to a chair (including a wheelchair)?: A Little Help needed standing up from a chair using your arms (e.g., wheelchair or bedside chair)?: None Help needed to walk in hospital room?: A Little Help needed climbing 3-5 steps with a railing? : A Little 6 Click Score: 20    End of Session Equipment Utilized During Treatment: Gait belt;Oxygen Activity Tolerance: Patient tolerated treatment well Patient left: in chair;with call bell/phone within reach;with chair alarm set;with nursing/sitter in room Nurse Communication: Mobility status PT Visit Diagnosis: Unsteadiness on feet (R26.81);Other abnormalities of gait and mobility (R26.89);Muscle weakness (generalized) (M62.81)    Time: 9476-5465 PT Time Calculation (min) (ACUTE ONLY): 38 min   Charges:   PT Evaluation $PT Eval Moderate Complexity: 1 Mod PT Treatments $Therapeutic Activity: 23-37 mins        8:57 AM , 03/21/20 Karlyn Agee, SPT Physical Therapy with [place]  [facility] [phone] office     This qualified practitioner was present in the room guiding the student in service delivery.  Therapy student was participating in the provision of services, and the practitioner was not engaged in treating another patient or doing other tasks at the same time. 8:58 AM, 03/21/20 Mearl Latin PT, DPT Physical Therapist at [facility]

## 2020-03-21 NOTE — Consult Note (Addendum)
[facility] Nurse Consult Note: Reason for Consult: Consult requested for bilat feet.  Performed remotely after review of photos in the EMR and progress notes.  Wound type: Pt has several chronic full thickness wounds to plantar areas of toes and in between spaces of toes/feet.  Red and moist.  Dressing procedure/placement/frequency: topical treatment orders provided for bedside nurses to perform as follows to provide antimicrobial benefits and absorb drainage: Apply Aquacel Kellie Simmering # [phone]) to bilat toe wounds/spaces between toes and feet Q day, then cover with kerlex.  Moisten previous dressings with NS to remove each time.  Please re-consult if further assistance is needed.  Thank-you,  Julien Girt MSN, [place], [place], [place], [place]

## 2020-03-21 NOTE — Progress Notes (Signed)
Patient Demographics:    Joy Yoder, is a 54 y.o. female, DOB - 07/25/1966, IOE:[id number]  Admit date - 03/18/2020   Admitting Physician Bernadette Hoit, DO  Outpatient Primary MD for the patient is [name], [name], DO  LOS - 2   Chief Complaint  Patient presents with  . Emesis        Subjective:    [name] patient reporting intermittent, spells, still requiring higher level of oxygen supplementation and persistent shortness of breath with activity.  No chest pain, no nausea, no vomiting.  Slowly feeling better.    Assessment  & Plan :    Principal Problem:   Aspiration pneumonia ([place]) Active Problems:   Hypertension   Type 2 diabetes mellitus without complication (HCC)   Nausea & vomiting   Acute on chronic respiratory failure with hypoxia (HCC)   Chronic diastolic heart failure (HCC)   Chronic kidney disease, stage III (moderate)   COPD, severe (HCC)   Hyperglycemia due to diabetes mellitus ([place])   Atypical chest pain   Abdominal pain   Constipation   Sepsis ([place])   Brief Summary:- 54 y.o.femalewith medical history significant forhypertension, hyperlipidemia, CHF, COPD with chronic hypoxic respiratory failure requiring 3 L of oxygen, diabetes mellitus,CKDand anemia (type unknown; requires intermittent blood transfusion and monthly vitamin B12 injection admitted on 03/19/2020 with sepsis secondary to bilateral pneumonia with acute on chronic hypoxic respiratory failure   A/p  1)Sepsis secondary to bilateral Pneumonia with acute on chronic respiratory failure- -On admission patient had leukocytosis with a white count of 10.9, tachypnea, tachycardia and worsening hypoxia (at baseline usually on 2-3 L , required up to 5 L this admission) -WBC 10.9 >>9.1, stable heart rate and no fever. -Will transition antibiotics to oral regimen and plan to treat for a total of 8  days. -Continue to wean down oxygen supplementation to baseline. -Continue supportive care.  2)decubitus ulcers of toes--- continue to follow wound care service recommendations,.  See photos in epic   3)HFpEF/HTN--- patient with chronic diastolic dysfunction CHF, continue Imdur and Coreg.... Patient also on amlodipine for HTN -Echo with preserved EF of 65 to 70% -Low-sodium diet and daily weight has been emphasized.  4)DM2--A1c is 8.8 reflecting uncontrolled DM, Use Novolog/Humalog Sliding scale insulin with Accu-Cheks/Fingersticks as ordered -Modified carbohydrate diet has been encouraged.   5)HTN--c/n Amlodipine, now 7.5 mg daily, Coreg 25 mg twice daily and hydralazine TID -follow BP and further adjust regimen as needed.  6)Elevated Troponin--- Trop 64 >> 78... In the setting of respiratory failure with hypoxia suspect some degree of demand ischemia. -Patient denies chest pain -No abnormalities appreciated on telemetry or EKG to suggest acute ischemia. -will discontinue telemetry at this point.  7)Chronic Anemia--- multifactorial history of iron deficiency and B12 deficiency requiring both PRBC transfusions and B12 injections chronically--- hemoglobin currently 8.0 which is close to patient's baseline -No signs of overt bleeding.  8)CKD IIIb --- creatinine is 2.0 up from 1.64 which is close to patient's baseline -renally adjust medications, avoid nephrotoxic agents / dehydration  / hypotension -Advised to maintain adequate hydration.  9) generalized weakness and deconditioning--- PT eval appreciated.  Will arrange home health services based on recommendations at time of discharge.  HH PT orders in place.  Status is: Inpatient  Disposition: The patient is from: Home              Anticipated d/c is to: Home              Anticipated d/c date is: 1 day              Patient currently is not medically stable to d/c.   Barriers: Not Clinically Stable-patient presented with  acute on chronic respiratory failure with hypoxia in the setting of pneumonia.  Requiring higher level of oxygen supplementation on presentation and receiving IV antibiotics.  Still having some ongoing coughing spells and increase secretions through her trach; but overall improved and afebrile.  We will transition antibiotics to oral regimen and continue to titrate down to baseline O2 prior to discharge hopefully in the next 24 hours.  Code Status : full  Family Communication:   (patient is alert, awake and coherent)   Consults  :  na  DVT Prophylaxis  :    - Heparin -    Lab Results  Component Value Date   PLT 195 03/20/2020    Inpatient Medications  Scheduled Meds: . [START ON 03/22/2020] amLODipine  7.5 mg Oral Daily  . amoxicillin-clavulanate  1 tablet Oral Q12H  . atorvastatin  80 mg Oral q1800  . carvedilol  25 mg Oral BID WC  . chlorhexidine  15 mL Mouth Rinse BID  . clopidogrel  75 mg Oral Daily  . dextromethorphan-guaiFENesin  1 tablet Oral BID  . docusate sodium  100 mg Oral Daily  . heparin  5,000 Units Subcutaneous Q8H  . hydrALAZINE  100 mg Oral TID  . insulin aspart  0-5 Units Subcutaneous QHS  . insulin aspart  0-9 Units Subcutaneous TID WC  . isosorbide mononitrate  30 mg Oral q AM  . lidocaine  1 patch Transdermal Q24H  . mouth rinse  15 mL Mouth Rinse q12n4p  . pantoprazole  40 mg Oral q AM  . polyethylene glycol  17 g Oral Daily   Continuous Infusions:  PRN Meds:.acetaminophen **OR** acetaminophen, hydrOXYzine, ipratropium-albuterol, labetalol, ondansetron (ZOFRAN) IV, oxyCODONE, senna    Anti-infectives (From admission, onward)   Start     Dose/Rate Route Frequency Ordered Stop   03/21/20 2200  amoxicillin-clavulanate (AUGMENTIN) 500-125 MG per tablet 500 mg     Discontinue     1 tablet Oral Every 12 hours 03/21/20 1607     03/18/20 2145  cefTRIAXone (ROCEPHIN) 2 g in sodium chloride 0.9 % 100 mL IVPB  Status:  Discontinued        2 g 200 mL/hr over  30 Minutes Intravenous Every 24 hours 03/18/20 2138 03/21/20 1607   03/18/20 2145  azithromycin (ZITHROMAX) 500 mg in sodium chloride 0.9 % 250 mL IVPB  Status:  Discontinued        500 mg 250 mL/hr over 60 Minutes Intravenous Every 24 hours 03/18/20 2138 03/21/20 1607        Objective:   Vitals:   03/20/20 2250 03/21/20 0439 03/21/20 1308 03/21/20 1321  BP: (!) 151/57 (!) 177/61  (!) 170/65  Pulse: 60 (!) 58 60 60  Resp:  20 20 16   Temp:  98.1 F (36.7 C)  (!) 97.5 F (36.4 C)  TempSrc:  Oral    SpO2:  100% 96% 100%  Weight:      Height:        Wt Readings from Last 3 Encounters:  03/18/20 78.5 kg  11/14/19 82.2 kg  11/11/19 81.9 kg    Intake/Output Summary (Last 24 hours) at 03/21/2020 1610 Last data filed at 03/21/2020 1516 Gross per 24 hour  Intake 120 ml  Output 2000 ml  Net -1880 ml   Physical Exam General exam: Alert, awake, oriented x 3; currently afebrile and expressing improvement in her breathing.  Still with intermittent coughing spells and increased secretions on her trach.  No nausea, no vomiting, no chest pain. Respiratory system: Bilateral rhonchi appreciated; currently no wheezing, fair air movement.  No using accessory muscles.  Trach in place. Cardiovascular system:RRR.  No rubs, no gallops, no appreciated JVD. Gastrointestinal system: Abdomen is nondistended, soft and nontender. No organomegaly or masses felt. Normal bowel sounds heard. Central nervous system: Alert and oriented. No focal neurological deficits. Extremities: No cyanosis or clubbing; no edema. Skin: No petechiae; positive toe ulcers appreciated and unchanged; please refer to media section in epic for images. Psychiatry: Judgement and insight appear normal. Mood & affect appropriate.     Data Review:   Micro Results Recent Results (from the past 240 hour(s))  SARS Coronavirus 2 by RT PCR (hospital order, performed in [facility] hospital lab) Nasopharyngeal Nasopharyngeal Swab      Status: None   Collection Time: 03/18/20  9:45 PM   Specimen: Nasopharyngeal Swab  Result Value Ref Range Status   SARS Coronavirus 2 NEGATIVE NEGATIVE Final    Comment: (NOTE) SARS-CoV-2 target nucleic acids are NOT DETECTED.  The SARS-CoV-2 RNA is generally detectable in upper and lower respiratory specimens during the acute phase of infection. The lowest concentration of SARS-CoV-2 viral copies this assay can detect is 250 copies / mL. A negative result does not preclude SARS-CoV-2 infection and should not be used as the sole basis for treatment or other patient management decisions.  A negative result may occur with improper specimen collection / handling, submission of specimen other than nasopharyngeal swab, presence of viral mutation(s) within the areas targeted by this assay, and inadequate number of viral copies (<250 copies / mL). A negative result must be combined with clinical observations, patient history, and epidemiological information.  Fact Sheet for Patients:   StrictlyIdeas.no  Fact Sheet for Healthcare Providers: BankingDealers.co.za  This test is not yet approved or  cleared by the [place] FDA and has been authorized for detection and/or diagnosis of SARS-CoV-2 by FDA under an Emergency Use Authorization (EUA).  This EUA will remain in effect (meaning this test can be used) for the duration of the COVID-19 declaration under Section 564(b)(1) of the Act, 21 U.S.C. section 360bbb-3(b)(1), unless the authorization is terminated or revoked sooner.  Performed at [facility], [address], [place], [place] [address]   Culture, blood (routine x 2)     Status: None (Preliminary result)   Collection Time: 03/18/20  9:46 PM   Specimen: BLOOD RIGHT FOREARM  Result Value Ref Range Status   Specimen Description BLOOD RIGHT FOREARM  Final   Special Requests   Final    BOTTLES DRAWN AEROBIC AND ANAEROBIC Blood  Culture adequate volume   Culture   Final    NO GROWTH 3 DAYS Performed at [facility], [address], [place], [place] [address]    Report Status PENDING  Incomplete  Culture, blood (routine x 2)     Status: None (Preliminary result)   Collection Time: 03/18/20 10:56 PM   Specimen: BLOOD LEFT HAND  Result Value Ref Range Status   Specimen Description BLOOD LEFT HAND  Final   Special Requests   Final    BOTTLES DRAWN AEROBIC AND ANAEROBIC Blood Culture adequate volume   Culture   Final    NO GROWTH 3 DAYS Performed at [facility], [address], [place], [place] [address]    Report Status PENDING  Incomplete    Radiology Reports CT ABDOMEN PELVIS W CONTRAST  Result Date: 03/18/2020 CLINICAL DATA:  Vomiting.  Weakness.  COPD.  Hysterectomy. EXAM: CT ABDOMEN AND PELVIS WITH CONTRAST TECHNIQUE: Multidetector CT imaging of the abdomen and pelvis was performed using the standard protocol following bolus administration of intravenous contrast. CONTRAST:  11mL OMNIPAQUE IOHEXOL 300 MG/ML  SOLN COMPARISON:  07/05/2017 renal ultrasound. 07/04/2017 abdominopelvic CT. FINDINGS: Lower chest: Left greater than right base airspace disease. Mild cardiomegaly. Hepatobiliary: Minimal motion degradation throughout the abdomen. Normal liver. Cholecystectomy, without biliary ductal dilatation. Pancreas: Normal, without mass or ductal dilatation. Spleen: Normal in size, without focal abnormality. Adrenals/Urinary Tract: Normal adrenal glands. Normal kidneys, without hydronephrosis. Normal urinary bladder. Stomach/Bowel: Gastric antral underdistention. Colonic stool burden suggests constipation. Normal terminal ileum. Diminutive appendix. Normal small bowel. Vascular/Lymphatic: Aortic atherosclerosis. 1.0 cm right inguinal node is unchanged. 1.0 cm left inguinal node is decreased from 1.5 cm on the prior. Reproductive: Hysterectomy.  No adnexal mass. Other: No significant free fluid. No free intraperitoneal air.  Anterior abdominal wall battery pack for dorsal spinal stimulator, causing mild beam hardening artifact. 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 IMPRESSION: 1. No acute process in the abdomen or pelvis. 2. Possible constipation. 3. Left greater than right base airspace disease, most consistent with infection or aspiration. 4. Aortic Atherosclerosis (ICD10-I70.0). 5. 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 Electronically Signed   By: Abigail Miyamoto M.D.   On: 03/18/2020 15:48   DG Chest Port 1 View  Result Date: 03/18/2020 CLINICAL DATA:  Fever. EXAM: PORTABLE CHEST 1 VIEW COMPARISON:  November 11, 2019 FINDINGS: There is stable tracheostomy tube positioning. Mild atelectasis and/or infiltrate is seen within the left lung base. This represents a new finding when compared to the prior study. There is no evidence of a pleural effusion or pneumothorax. The cardiac silhouette is borderline in size. The visualized skeletal structures are unremarkable. IMPRESSION: Mild left basilar atelectasis and/or infiltrate, new when compared to the prior study. Electronically Signed   By: Virgina Norfolk M.D.   On: 03/18/2020 21:54   ECHOCARDIOGRAM COMPLETE  Result Date: 03/20/2020    ECHOCARDIOGRAM REPORT   Patient Name:   ANGELLE ISAIS Lehn Date of Exam: 03/20/2020 Medical Rec #:  [id number]               Height:       62.0 in Accession #:    [id number]              Weight:       173.0 lb Date of Birth:  12/08/1965               BSA:          1.797 m Patient Age:    9 years                BP:           136/59 mmHg Patient Gender: F                       HR:           61 bpm. Exam Location:  [facility] Procedure: 2D Echo Indications:    Dyspnea 786.09 / R06.00  History:        Patient has no prior history of Echocardiogram examinations.                 COPD, Signs/Symptoms:Chest Pain; Risk Factors:Diabetes and                 Hypertension. Sepsis.  Sonographer:     Leavy Cella RDCS (AE) Referring Phys: [id number] COURAGE EMOKPAE IMPRESSIONS  1. Left ventricular ejection fraction, by estimation, is 65 to 70%. The left ventricle has normal function. The left ventricle has no regional wall motion abnormalities. There is moderate left ventricular hypertrophy. Left ventricular diastolic parameters are indeterminate.  2. Right ventricular systolic function is normal. The right ventricular size is normal. Tricuspid regurgitation signal is inadequate for assessing PA pressure.  3. The mitral valve is grossly normal. Trivial mitral valve regurgitation.  4. The aortic valve is tricuspid. Aortic valve regurgitation is not visualized.  5. The inferior vena cava is normal in size with greater than 50% respiratory variability, suggesting right atrial pressure of 3 mmHg. FINDINGS  Left Ventricle: Left ventricular ejection fraction, by estimation, is 65 to 70%. The left ventricle has normal function. The left ventricle has no regional wall motion abnormalities. The left ventricular internal cavity size was normal in size. There is  moderate left ventricular hypertrophy. Left ventricular diastolic parameters are indeterminate. Right Ventricle: The right ventricular size is normal. No increase in right ventricular wall thickness. Right ventricular systolic function is normal. Tricuspid regurgitation signal is inadequate for assessing PA pressure. Left Atrium: Left atrial size was normal in size. Right Atrium: Right atrial size was normal in size. Pericardium: There is no evidence of pericardial effusion. Presence of pericardial fat pad. Mitral Valve: The mitral valve is grossly normal. Trivial mitral valve regurgitation. Tricuspid Valve: The tricuspid valve is grossly normal. Tricuspid valve regurgitation is trivial. Aortic Valve: The aortic valve is tricuspid. Aortic valve regurgitation is not visualized. Mild aortic valve annular calcification. Pulmonic Valve: The pulmonic valve was grossly  normal. Pulmonic valve regurgitation is trivial. Aorta: The aortic root is normal in size and structure. Venous: The inferior vena cava is normal in size with greater than 50% respiratory variability, suggesting right atrial pressure of 3 mmHg. IAS/Shunts: No atrial level shunt detected by color flow Doppler.  LEFT VENTRICLE PLAX 2D LVIDd:         3.85 cm  Diastology LVIDs:         2.24 cm  LV e' lateral:   7.72 cm/s LV PW:         1.38 cm  LV E/e' lateral: 11.0 LV IVS:        1.66 cm  LV e' medial:    6.20 cm/s LVOT diam:     2.00 cm  LV E/e' medial:  13.8 LVOT Area:     3.14 cm  RIGHT VENTRICLE RV S prime:     8.27 cm/s TAPSE (M-mode): 2.0 cm LEFT ATRIUM             Index       RIGHT ATRIUM           Index LA diam:        4.20 cm 2.34 cm/m  RA Area:     10.10 cm LA Vol (A2C):   30.4 ml 16.91 ml/m RA Volume:   22.10 ml  12.30 ml/m LA Vol (A4C):   31.7  ml 17.64 ml/m LA Biplane Vol: 32.4 ml 18.03 ml/m   AORTA Ao Root diam: 2.70 cm MITRAL VALVE MV Area (PHT): 3.63 cm    SHUNTS MV Decel Time: 209 msec    Systemic Diam: 2.00 cm MV E velocity: 85.30 cm/s MV A velocity: 46.70 cm/s MV E/A ratio:  1.83 Rozann Lesches MD Electronically signed by Rozann Lesches MD Signature Date/Time: 03/20/2020/3:37:58 PM    Final      CBC Recent Labs  Lab 03/18/20 1253 03/18/20 2256 03/19/20 0401 03/20/20 0612  WBC 10.3 8.9 10.9* 9.1  HGB 9.0* 7.9* 8.2* 8.0*  HCT 29.8* 26.1* 28.1* 27.2*  PLT 222 198 193 195  MCV 87.1 87.6 87.8 88.0  MCH 26.3 26.5 25.6* 25.9*  MCHC 30.2 30.3 29.2* 29.4*  RDW 16.0* 16.0* 16.1* 16.5*  LYMPHSABS 1.4 1.1  --   --   MONOABS 1.4* 1.0  --   --   EOSABS 0.0 0.0  --   --   BASOSABS 0.0 0.0  --   --     Chemistries  Recent Labs  Lab 03/18/20 1253 03/18/20 2256 03/19/20 0401 03/20/20 0612  NA 137 137  --  137  K 4.2 4.4  --  4.3  CL 105 106  --  105  CO2 25 24  --  24  GLUCOSE 197* 240*  --  177*  BUN 33* 30*  --  42*  CREATININE 1.71* 1.64*  --  2.03*  CALCIUM 8.3* 7.8*   --  8.2*  MG  --   --  1.3*  --   AST 18  --   --  15  ALT 19  --   --  14  ALKPHOS 77  --   --  54  BILITOT 0.4  --   --  0.3    Lab Results  Component Value Date   HGBA1C 8.8 (H) 03/19/2020    Coagulation profile Recent Labs  Lab 03/19/20 0401  INR 1.3*    Cardiac Enzymes No results for input(s): CKMB, TROPONINI, MYOGLOBIN in the last 168 hours.  Invalid input(s): CK ------------------------------------------------------------------------------------------------------------------    Component Value Date/Time   BNP 136.0 (H) 11/09/2019 2235     Barton Dubois M.D  03/21/2020 at 4:10 PM  Go to [email or web address] - for contact info  [facility] - Office  [phone]

## 2020-03-22 DIAGNOSIS — J189 Pneumonia, unspecified organism: Secondary | ICD-10-CM

## 2020-03-22 DIAGNOSIS — E1121 Type 2 diabetes mellitus with diabetic nephropathy: Secondary | ICD-10-CM

## 2020-03-22 LAB — GLUCOSE, CAPILLARY
Glucose-Capillary: 169 mg/dL — ABNORMAL HIGH (ref 70–99)
Glucose-Capillary: 188 mg/dL — ABNORMAL HIGH (ref 70–99)

## 2020-03-22 MED ORDER — AMLODIPINE BESYLATE 5 MG PO TABS
7.5000 mg | ORAL_TABLET | Freq: Every day | ORAL | 1 refills | Status: DC
Start: 2020-03-22 — End: 2021-12-23

## 2020-03-22 MED ORDER — CLOPIDOGREL BISULFATE 75 MG PO TABS: 75.0000 mg | ORAL_TABLET | Freq: Every day | ORAL | 2 refills | Status: AC

## 2020-03-22 MED ORDER — AMOXICILLIN-POT CLAVULANATE 500-125 MG PO TABS: 1.0000 | ORAL_TABLET | Freq: Two times a day (BID) | ORAL | 0 refills | Status: AC

## 2020-03-22 MED ORDER — PREDNISONE 20 MG PO TABS: ORAL_TABLET | ORAL | 0 refills | Status: DC

## 2020-03-22 MED ORDER — DM-GUAIFENESIN ER 30-600 MG PO TB12: 1.0000 | ORAL_TABLET | Freq: Two times a day (BID) | ORAL | 0 refills | Status: AC

## 2020-03-22 NOTE — Progress Notes (Signed)
Pt experiencing high level of anxiety. Pt stated she needs ativan and has a prescription for a higher dose of Atarax and nothing is helping to calm her down. Pt stated she is not crazy but she is afraid to go to sleep. Pt's daughter called and stated her mom had called her and stated no medication was helping. Pt daughter requested a repeat chest xray due to her mom stating she has chest pain and is restless. I advise the daughter this has been an ongoing complaint for the pt and the pt reports no new symptoms. PRN medication has been given and O2 sat is 100%.  Unable to notify MD until after scheduled downtime for computers.

## 2020-03-22 NOTE — Evaluation (Signed)
Clinical/Bedside Swallow Evaluation Patient Details  Name: Joy Yoder MRN: [id number] Date of Birth: [birth year]  Today's Date: 03/22/2020 Time: SLP Start Time (ACUTE ONLY): 1300 SLP Stop Time (ACUTE ONLY): 1325 SLP Time Calculation (min) (ACUTE ONLY): 25 min  Past Medical History:  Past Medical History:  Diagnosis Date  . Acute on chronic respiratory failure with hypoxia ([place])   . Anxiety   . CHF (congestive heart failure) ([place])   . Chronic diastolic heart failure ([place])   . Chronic kidney disease, stage III (moderate)   . COPD, severe ([place])   . DDD (degenerative disc disease)   . DDD (degenerative disc disease), thoracic    chest to back, legs,   . Diabetes mellitus   . DJD (degenerative joint disease)   . Hypertension   . Kidney disease    stage 3   . Lobar pneumonia ([place])   . Neuropathy   . Stroke [facility])    Past Surgical History:  Past Surgical History:  Procedure Laterality Date  . ABDOMINAL HYSTERECTOMY    . bladder tack    . CHOLECYSTECTOMY    . EYE SURGERY    . THYROID SURGERY     HPI:  Joy Yoder with medical history significant for hypertension, hyperlipidemia, CHF, COPD with chronic hypoxic respiratory failure requiring 3 L of oxygen, diabetes mellitus, CKD and anemia (type unknown; requires intermittent blood transfusion and monthly vitamin B12 injection admitted on 03/19/2020 with sepsis secondary to bilateral pneumonia with acute on chronic hypoxic respiratory failure. 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 She is followed by Dr. Theda Sers (ENT) and most recent note in July 2021 indicates that the trach may be in placed permanently. She wears a speaking valve during waking hours. BSE ordered due to coughing after PO intake yesterday. Joy Yoder reports h/o MBSS at [facility], however I could not locate.   Assessment / Plan / Recommendation Clinical Impression  Joy Yoder seen in room with lunch meal. Oral motor  exam is WNL, Joy Yoder with XLT Shiley #6 cuffless with speaking valve in place. Joy Yoder indicates that she hadn't eaten for "quite a while" and was very hungry yesterday and ate quickly and began coughing. BSE was ordered after that incident. She denies difficulty swallowing prior to this and says that she had a swallow study at [facility], however unable to locate records. Joy Yoder shows no overt signs or symptoms of aspiration today with regular textures and thin liquids, however she is at risk for aspiration given trach, history of PNA, and compromised respiratory status. Joy Yoder is being discharged today. Can complete outpatient MBSS if MD desires. SLP will sign off at this time.  Above to Joy Yoder and RN.   SLP Visit Diagnosis: Dysphagia, unspecified (R13.10)    Aspiration Risk  Mild aspiration risk    Diet Recommendation Regular;Thin liquid   Liquid Administration via: Cup;Straw Medication Administration: Whole meds with liquid Supervision: Patient able to self feed Compensations: Slow rate;Small sips/bites Postural Changes: Seated upright at 90 degrees;Remain upright for at least 30 minutes after po intake    Other  Recommendations Oral Care Recommendations: Oral care BID Other Recommendations: Clarify dietary restrictions   Follow up Recommendations None      Frequency and Duration            Prognosis Prognosis for Safe Diet Advancement: Good      Swallow Study   General Date of Onset: 03/18/20 HPI: Joy Yoder with medical history  significant for hypertension, hyperlipidemia, CHF, COPD with chronic hypoxic respiratory failure requiring 3 L of oxygen, diabetes mellitus, CKD and anemia (type unknown; requires intermittent blood transfusion and monthly vitamin B12 injection admitted on 03/19/2020 with sepsis secondary to bilateral pneumonia with acute on chronic hypoxic respiratory failure. 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 She is followed by  Dr. Theda Sers (ENT) and most recent note in July 2021 indicates that the trach may be in placed permanently. She wears a speaking valve during waking hours. BSE ordered due to coughing after PO intake yesterday. Joy Yoder reports h/o MBSS at [facility], however I could not locate. Type of Study: Bedside Swallow Evaluation Previous Swallow Assessment: none on record, but Joy Yoder reports one at [place] Prior to this Study: Dysphagia 2 (chopped);Thin liquids Temperature Spikes Noted: No Respiratory Status: Trach Trach Size and Type: #6;Extra long;Uncuffed;With PMSV in place History of Recent Intubation: No Behavior/Cognition: Alert;Cooperative;Pleasant mood Oral Cavity Assessment: Within Functional Limits Oral Care Completed by SLP: No Oral Cavity - Dentition: Adequate natural dentition Vision: Functional for self-feeding Self-Feeding Abilities: Able to feed self Patient Positioning: Upright in chair Baseline Vocal Quality: Normal Volitional Cough: Strong Volitional Swallow: Able to elicit    Oral/Motor/Sensory Function Overall Oral Motor/Sensory Function: Within functional limits   Ice Chips Ice chips: Within functional limits Presentation: Spoon   Thin Liquid Thin Liquid: Within functional limits Presentation: Straw    Nectar Thick Nectar Thick Liquid: Not tested   Honey Thick Honey Thick Liquid: Not tested   Puree Puree: Not tested   Solid     Solid: Within functional limits Presentation: Self Fed     Thank you,  Genene Churn, [place] 03/22/2020,2:02 PM

## 2020-03-22 NOTE — Progress Notes (Signed)
Discharge instructions (including medications) discussed with and copy provided to patient/caregiver. IVs remove per policy and bleeding controlled. Patient awaiting daughter to come pick her up.

## 2020-03-22 NOTE — TOC Transition Note (Signed)
Transition of Care [facility]) - CM/SW Discharge Note   Patient Details  Name: Joy Yoder MRN: [id number] Date of Birth: Jun 01, 1966  [facility] CM/SW Contact:  Shade Flood, LCSW Phone Number: 03/22/2020, 1:23 PM   Clinical Narrative:     Pt stable for dc per MD. PT recommending HH PT. Met with pt today to discuss. Pt agreeable to HHPT and she requests referral to [facility]. Pt stated she had all DME needs met at home. She reports that her daughter will be picking her up from the hospital and she will bring a portable O2 tank with her.   Referral called to [facility] and faxed clinical and orders as requested.   There were no other TOC needs identified for dc.  Final next level of care: [place] Barriers to Discharge: Barriers Resolved   Patient Goals and CMS Choice Patient states their goals for this hospitalization and ongoing recovery are:: go home CMS Medicare.gov Compare Post Acute Care list provided to:: Patient Choice offered to / list presented to : Patient  Discharge Placement                       Discharge Plan and Services In-house Referral: Clinical Social Work                        HH Arranged: PT [place] Agency: [place], [place]), [place] Date [place]: 03/22/20 Time [place]: [phone] Representative spoke with at [place]: [place] ([place]) Interventions     Readmission Risk Interventions Readmission Risk Prevention Plan 03/21/2020  Transportation Screening Complete  Medication Review Press photographer) Complete  SW Recovery Care/Counseling Consult Complete  Some recent data might be hidden

## 2020-03-22 NOTE — Plan of Care (Signed)

## 2020-03-22 NOTE — Discharge Summary (Signed)
Physician Discharge Summary  Joy Yoder LKG:[id number] DOB: Dec 31, 1965 DOA: 03/18/2020  PCP: Nanetta Batty, DO  Admit date: 03/18/2020 Discharge date: 03/22/2020  Time spent: 35 minutes  Recommendations for Outpatient Follow-up:  Repeat chest x-ray in 6 weeks to assure complete resolution of infiltrate Repeat basic metabolic panel to follow electrolytes and renal function Reassess blood pressure and further adjust antihypertensive regimen as required Make sure patient has follow-up with pulmonologist/chronic trach clinic. Continue to follow CBGs/A1c and further adjust hypoglycemic regimen as needed. Repeat CBC to follow hemoglobin stability.   Discharge Diagnoses:  Principal Problem:   Aspiration pneumonia ([place]) Active Problems:   Hypertension   Type 2 diabetes with nephropathy (HCC)   Nausea & vomiting   Acute on chronic respiratory failure with hypoxia (HCC)   Chronic diastolic heart failure (HCC)   Chronic kidney disease, stage III (moderate)   COPD, severe (HCC)   Hyperglycemia due to diabetes mellitus ([place])   Atypical chest pain   Abdominal pain   Constipation   Sepsis ([place])   Community acquired pneumonia of left lower lobe of lung   Discharge Condition: Stable and improved.  Discharged home with instruction to follow-up with PCP in 10 days and with arrange home health services for physical therapy.  CODE STATUS: Full code  Diet recommendation: Heart healthy diet, modified carbohydrate diet.  Filed Weights   03/18/20 1116  Weight: 78.5 kg    Brief history of present illness:  54 y.o.femalewith medical history significant forhypertension, hyperlipidemia, CHF, COPD with chronic hypoxic respiratory failure requiring 3 L of oxygen, diabetes mellitus,CKDand anemia (type unknown; requires intermittent blood transfusion and monthly vitamin B12 injectionadmitted on 03/19/2020 with sepsis secondary to bilateral pneumonia with acute on chronic hypoxic  respiratory failure   Hospital Course:  1-sepsis secondary to bilateral pneumonia with acute on chronic respiratory failure with hypoxia -Patient chronically trach dependent and using 3-4 L nasal cannula supplementation -Sepsis features resolved at time of discharge; back to baseline oxygen supplementation. -Patient will complete oral antibiotic regimen for her infection and completed steroids tapering will continue using mucolytic's and bronchodilator regimen. -Repeat chest x-ray in 6 weeks to assure complete resolution of infiltrates.  2-bilateral ulceration/wounds of her toes -Continue to follow recommendations by wound care services to assist with healing -No signs of acute superimposed infection currently.  3-chronic diastolic heart failure/hypertension -Blood pressure found to be uncontrolled; medication has been adjusted for better management -Patient advised to follow low-sodium diet -Will resume daily Lasix and patient has been asked to check her weight on daily basis. -No signs of fluid overload at time of discharge -Last ejection fraction 65 to 70%.  4-type 2 diabetes mellitus with an A1c of 8.8, reflecting uncontrolled diabetes -Will resume insulin therapy as per home regimen and patient has been advised to follow modified carbohydrate diet and to check her CBGs routinely. -Outpatient follow-up with PCP to further adjust diabetes management has been instructed.  5-elevated troponin:  -No telemetry or EKG abnormality -No further chest pain complaints reported -Most likely demand ischemia in the setting of acute on chronic hypoxic respiratory failure.  6-anemia of chronic kidney disease -Hemoglobin is stable and no signs of overt bleeding appreciated -Repeat CBC to follow hemoglobin stability at follow-up visit.  7-chronic kidney disease a stage IIIb -Stable and at baseline -Patient advised to maintain adequate hydration -Minimize the use of nephrotoxic agents as an  outpatient.  8-generalized weakness and deconditioning -Physical therapy has seen patient and is recommending home health PT at  discharge -Arrangements has been made to facilitate home adaptation and conditioning.  9-gastroesophageal flux disease -Continue PPI  10-depression/anxiety -A stable mood -No suicidal ideation hallucination -Resume home antidepressant and anxiolytic regimen.  Procedures: See below for x-ray reports  Consultations:  None  Discharge Exam: Vitals:   03/22/20 0811 03/22/20 1215  BP:    Pulse:  64  Resp:  16  Temp:    SpO2: 98% 96%   General exam: Alert, awake, oriented x 3; currently afebrile and expressing significant improvement in her breathing.  Still with intermittent coughing spells and increased secretions on her trach, but much improved and is speaking in full sentences.  Patient ready to go home.  No chest pain, no nausea, no vomiting using 3-4 L through trach (which are baseline supplementation). Respiratory system: Bilateral rhonchi appreciated; currently no wheezing, fair air movement.  No using accessory muscles.  Trach in place. Cardiovascular system:RRR.  No rubs, no gallops, no appreciated JVD. Gastrointestinal system: Abdomen is nondistended, soft and nontender. No organomegaly or masses felt. Normal bowel sounds heard. Central nervous system: Alert and oriented. No focal neurological deficits. Extremities: No cyanosis or clubbing; no edema. Skin: No petechiae; positive toe ulcers appreciated and unchanged; please refer to media section in epic for images. Psychiatry: Judgement and insight appear normal. Mood & affect appropriate.    Discharge Instructions   Discharge Instructions    (HEART FAILURE PATIENTS) Call MD:  Anytime you have any of the following symptoms: 1) 3 pound weight gain in 24 hours or 5 pounds in 1 week 2) shortness of breath, with or without a dry hacking cough 3) swelling in the hands, feet or stomach 4) if you  have to sleep on extra pillows at night in order to breathe.   Complete by: As directed    Diet - low sodium heart healthy   Complete by: As directed    Discharge instructions   Complete by: As directed    Take medications as prescribed Arrange follow-up with PCP in 10 days Maintain adequate hydration and follow low-sodium diet Check weight on daily basis   Discharge wound care:   Complete by: As directed    Clean and dry thoroughly; apply Aquacel to bilateral toe wounds/spaces between toes on feet on daily basis and cover with Kerlix.  At time of removing dressings please apply normal saline to moisten and carefully removed.   Increase activity slowly   Complete by: As directed      Allergies as of 03/22/2020      Reactions   Metformin And Related Diarrhea   Nsaids    Kidney disease   Pravastatin Swelling   Reglan [metoclopramide]    shakes   Tramadol Itching   Vancomycin Other (See Comments)   Kidney failure Kidney failure      Medication List    STOP taking these medications   doxycycline 100 MG tablet Commonly known as: VIBRA-TABS   Eliquis 5 MG Tabs tablet Generic drug: apixaban   HYDROcodone-acetaminophen 5-325 MG tablet Commonly known as: NORCO/VICODIN     TAKE these medications   albuterol 108 (90 Base) MCG/ACT inhaler Commonly known as: VENTOLIN HFA Inhale 2 puffs into the lungs every 6 (six) hours.   albuterol (2.5 MG/3ML) 0.083% nebulizer solution Commonly known as: PROVENTIL Take 2.5 mg by nebulization 3 (three) times daily as needed.   amitriptyline 25 MG tablet Commonly known as: ELAVIL Take 50 mg by mouth at bedtime.   amLODipine 5 MG tablet Commonly known  as: NORVASC Take 1.5 tablets (7.5 mg total) by mouth daily. What changed: how much to take   amoxicillin-clavulanate 500-125 MG tablet Commonly known as: AUGMENTIN Take 1 tablet (500 mg total) by mouth every 12 (twelve) hours for 4 days.   atorvastatin 80 MG tablet Commonly known as:  LIPITOR Take 80 mg by mouth daily at 6 PM.   carvedilol 25 MG tablet Commonly known as: COREG Take 25 mg by mouth 2 (two) times daily with a meal.   clopidogrel 75 MG tablet Commonly known as: PLAVIX Take 1 tablet (75 mg total) by mouth daily.   cyclobenzaprine 10 MG tablet Commonly known as: FLEXERIL Take 10 mg by mouth 3 (three) times daily as needed for muscle spasms.   dextromethorphan-guaiFENesin 30-600 MG 12hr tablet Commonly known as: MUCINEX DM Take 1 tablet by mouth 2 (two) times daily.   furosemide 20 MG tablet Commonly known as: LASIX Take 20 mg by mouth in the morning.   gabapentin 600 MG tablet Commonly known as: NEURONTIN Take 600 mg by mouth 3 (three) times daily.   hydrALAZINE 100 MG tablet Commonly known as: APRESOLINE Take 100 mg by mouth 3 (three) times daily.   hydrOXYzine 50 MG tablet Commonly known as: ATARAX/VISTARIL Take 50 mg by mouth in the morning and at bedtime.   insulin lispro 100 UNIT/ML injection Commonly known as: HUMALOG Inject 5-16 Units into the skin 3 (three) times daily before meals. Patient injects according to sliding scale at home.   ipratropium-albuterol 0.5-2.5 (3) MG/3ML Soln Commonly known as: DUONEB Inhale 3 mLs into the lungs 3 (three) times daily as needed.   isosorbide mononitrate 30 MG 24 hr tablet Commonly known as: IMDUR Take 30 mg by mouth in the morning.   Lantus SoloStar 100 UNIT/ML Solostar Pen Generic drug: insulin glargine Inject 50 Units into the skin at bedtime.   mirtazapine 15 MG tablet Commonly known as: REMERON Take 15 mg by mouth at bedtime as needed.   ondansetron 4 MG disintegrating tablet Commonly known as: ZOFRAN-ODT Take 1 tablet by mouth daily as needed.   OneTouch Delica Lancets 39J Misc Apply 1 each topically in the morning and at bedtime.   OneTouch Verio test strip Generic drug: glucose blood 1 each by Other route in the morning and at bedtime.   pantoprazole 40 MG  tablet Commonly known as: PROTONIX Take 40 mg by mouth in the morning.   predniSONE 20 MG tablet Commonly known as: Deltasone Take 3 tablets by mouth daily x2 days; then 2 tablets by mouth daily x2 days; then 1 tablet by mouth daily x3 days; then half tablet by mouth daily x3 days and stop prednisone. What changed:   medication strength  how much to take  how to take this  when to take this  additional instructions   sodium chloride 0.9 % nebulizer solution Take 1 mL by nebulization in the morning and at bedtime.   venlafaxine XR 150 MG 24 hr capsule Commonly known as: EFFEXOR-XR Take 150 mg by mouth every evening.            Discharge Care Instructions  (From admission, onward)         Start     Ordered   03/22/20 0000  Discharge wound care:       Comments: Clean and dry thoroughly; apply Aquacel to bilateral toe wounds/spaces between toes on feet on daily basis and cover with Kerlix.  At time of removing dressings please apply normal  saline to moisten and carefully removed.   03/22/20 1309         Allergies  Allergen Reactions  . Metformin And Related Diarrhea  . Nsaids     Kidney disease  . Pravastatin Swelling  . Reglan [Metoclopramide]     shakes  . Tramadol Itching  . Vancomycin Other (See Comments)    Kidney failure Kidney failure     Follow-up Information    [name], [name], DO. Schedule an appointment as soon as possible for a visit in 10 day(s).   Specialty: Family Medicine Contact information: [address] [place] [address] [phone]                The results of significant diagnostics from this hospitalization (including imaging, microbiology, ancillary and laboratory) are listed below for reference.    Significant Diagnostic Studies: DG Chest 2 View  Result Date: 03/21/2020 CLINICAL DATA:  54 year old female with shortness of breath. CHF and COPD. EXAM: CHEST - 2 VIEW COMPARISON:  Chest radiograph dated  03/18/2020. FINDINGS: Tracheostomy above the carina. There is mild cardiomegaly with mild vascular congestion. No focal consolidation, large pleural effusion, or pneumothorax. No acute osseous pathology. IMPRESSION: 1. No focal consolidation. 2. Mild cardiomegaly with mild vascular congestion. Electronically Signed   By: Anner Crete M.D.   On: 03/21/2020 17:23   CT ABDOMEN PELVIS W CONTRAST  Result Date: 03/18/2020 CLINICAL DATA:  Vomiting.  Weakness.  COPD.  Hysterectomy. EXAM: CT ABDOMEN AND PELVIS WITH CONTRAST TECHNIQUE: Multidetector CT imaging of the abdomen and pelvis was performed using the standard protocol following bolus administration of intravenous contrast. CONTRAST:  56mL OMNIPAQUE IOHEXOL 300 MG/ML  SOLN COMPARISON:  07/05/2017 renal ultrasound. 07/04/2017 abdominopelvic CT. FINDINGS: Lower chest: Left greater than right base airspace disease. Mild cardiomegaly. Hepatobiliary: Minimal motion degradation throughout the abdomen. Normal liver. Cholecystectomy, without biliary ductal dilatation. Pancreas: Normal, without mass or ductal dilatation. Spleen: Normal in size, without focal abnormality. Adrenals/Urinary Tract: Normal adrenal glands. Normal kidneys, without hydronephrosis. Normal urinary bladder. Stomach/Bowel: Gastric antral underdistention. Colonic stool burden suggests constipation. Normal terminal ileum. Diminutive appendix. Normal small bowel. Vascular/Lymphatic: Aortic atherosclerosis. 1.0 cm right inguinal node is unchanged. 1.0 cm left inguinal node is decreased from 1.5 cm on the prior. Reproductive: Hysterectomy.  No adnexal mass. Other: No significant free fluid. No free intraperitoneal air. Anterior abdominal wall battery pack for dorsal spinal stimulator, causing mild beam hardening artifact. 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 IMPRESSION: 1. No acute process in the abdomen or pelvis. 2. Possible constipation. 3. Left greater  than right base airspace disease, most consistent with infection or aspiration. 4. Aortic Atherosclerosis (ICD10-I70.0). 5. 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 Electronically Signed   By: Abigail Miyamoto M.D.   On: 03/18/2020 15:48   DG Chest Port 1 View  Result Date: 03/18/2020 CLINICAL DATA:  Fever. EXAM: PORTABLE CHEST 1 VIEW COMPARISON:  November 11, 2019 FINDINGS: There is stable tracheostomy tube positioning. Mild atelectasis and/or infiltrate is seen within the left lung base. This represents a new finding when compared to the prior study. There is no evidence of a pleural effusion or pneumothorax. The cardiac silhouette is borderline in size. The visualized skeletal structures are unremarkable. IMPRESSION: Mild left basilar atelectasis and/or infiltrate, new when compared to the prior study. Electronically Signed   By: Virgina Norfolk M.D.   On: 03/18/2020 21:54   ECHOCARDIOGRAM COMPLETE  Result Date: 03/20/2020    ECHOCARDIOGRAM  REPORT   Patient Name:   KENITA BINES Bauers Date of Exam: 03/20/2020 Medical Rec #:  [id number]               Height:       62.0 in Accession #:    [id number]              Weight:       173.0 lb Date of Birth:  [birth year]               BSA:          1.797 m Patient Age:    45 years                BP:           136/59 mmHg Patient Gender: F                       HR:           61 bpm. Exam Location:  [facility] Procedure: 2D Echo Indications:    Dyspnea 786.09 / R06.00  History:        Patient has no prior history of Echocardiogram examinations.                 COPD, Signs/Symptoms:Chest Pain; Risk Factors:Diabetes and                 Hypertension. Sepsis.  Sonographer:    Leavy Cella RDCS (AE) Referring Phys: [id number] COURAGE EMOKPAE IMPRESSIONS  1. Left ventricular ejection fraction, by estimation, is 65 to 70%. The left ventricle has normal function. The left ventricle has no regional wall motion abnormalities. There is moderate left ventricular  hypertrophy. Left ventricular diastolic parameters are indeterminate.  2. Right ventricular systolic function is normal. The right ventricular size is normal. Tricuspid regurgitation signal is inadequate for assessing PA pressure.  3. The mitral valve is grossly normal. Trivial mitral valve regurgitation.  4. The aortic valve is tricuspid. Aortic valve regurgitation is not visualized.  5. The inferior vena cava is normal in size with greater than 50% respiratory variability, suggesting right atrial pressure of 3 mmHg. FINDINGS  Left Ventricle: Left ventricular ejection fraction, by estimation, is 65 to 70%. The left ventricle has normal function. The left ventricle has no regional wall motion abnormalities. The left ventricular internal cavity size was normal in size. There is  moderate left ventricular hypertrophy. Left ventricular diastolic parameters are indeterminate. Right Ventricle: The right ventricular size is normal. No increase in right ventricular wall thickness. Right ventricular systolic function is normal. Tricuspid regurgitation signal is inadequate for assessing PA pressure. Left Atrium: Left atrial size was normal in size. Right Atrium: Right atrial size was normal in size. Pericardium: There is no evidence of pericardial effusion. Presence of pericardial fat pad. Mitral Valve: The mitral valve is grossly normal. Trivial mitral valve regurgitation. Tricuspid Valve: The tricuspid valve is grossly normal. Tricuspid valve regurgitation is trivial. Aortic Valve: The aortic valve is tricuspid. Aortic valve regurgitation is not visualized. Mild aortic valve annular calcification. Pulmonic Valve: The pulmonic valve was grossly normal. Pulmonic valve regurgitation is trivial. Aorta: The aortic root is normal in size and structure. Venous: The inferior vena cava is normal in size with greater than 50% respiratory variability, suggesting right atrial pressure of 3 mmHg. IAS/Shunts: No atrial level shunt  detected by color flow Doppler.  LEFT VENTRICLE PLAX 2D LVIDd:         3.85  cm  Diastology LVIDs:         2.24 cm  LV e' lateral:   7.72 cm/s LV PW:         1.38 cm  LV E/e' lateral: 11.0 LV IVS:        1.66 cm  LV e' medial:    6.20 cm/s LVOT diam:     2.00 cm  LV E/e' medial:  13.8 LVOT Area:     3.14 cm  RIGHT VENTRICLE RV S prime:     8.27 cm/s TAPSE (M-mode): 2.0 cm LEFT ATRIUM             Index       RIGHT ATRIUM           Index LA diam:        4.20 cm 2.34 cm/m  RA Area:     10.10 cm LA Vol (A2C):   30.4 ml 16.91 ml/m RA Volume:   22.10 ml  12.30 ml/m LA Vol (A4C):   31.7 ml 17.64 ml/m LA Biplane Vol: 32.4 ml 18.03 ml/m   AORTA Ao Root diam: 2.70 cm MITRAL VALVE MV Area (PHT): 3.63 cm    SHUNTS MV Decel Time: 209 msec    Systemic Diam: 2.00 cm MV E velocity: 85.30 cm/s MV A velocity: 46.70 cm/s MV E/A ratio:  1.83 Rozann Lesches MD Electronically signed by Rozann Lesches MD Signature Date/Time: 03/20/2020/3:37:58 PM    Final     Microbiology: Recent Results (from the past 240 hour(s))  SARS Coronavirus 2 by RT PCR (hospital order, performed in [place] hospital lab) Nasopharyngeal Nasopharyngeal Swab     Status: None   Collection Time: 03/18/20  9:45 PM   Specimen: Nasopharyngeal Swab  Result Value Ref Range Status   SARS Coronavirus 2 NEGATIVE NEGATIVE Final    Comment: (NOTE) SARS-CoV-2 target nucleic acids are NOT DETECTED.  The SARS-CoV-2 RNA is generally detectable in upper and lower respiratory specimens during the acute phase of infection. The lowest concentration of SARS-CoV-2 viral copies this assay can detect is 250 copies / mL. A negative result does not preclude SARS-CoV-2 infection and should not be used as the sole basis for treatment or other patient management decisions.  A negative result may occur with improper specimen collection / handling, submission of specimen other than nasopharyngeal swab, presence of viral mutation(s) within the areas targeted by this  assay, and inadequate number of viral copies (<250 copies / mL). A negative result must be combined with clinical observations, patient history, and epidemiological information.  Fact Sheet for Patients:   StrictlyIdeas.no  Fact Sheet for Healthcare Providers: BankingDealers.co.za  This test is not yet approved or  cleared by the [place] FDA and has been authorized for detection and/or diagnosis of SARS-CoV-2 by FDA under an Emergency Use Authorization (EUA).  This EUA will remain in effect (meaning this test can be used) for the duration of the COVID-19 declaration under Section 564(b)(1) of the Act, 21 U.S.C. section 360bbb-3(b)(1), unless the authorization is terminated or revoked sooner.  Performed at [facility], [address] [place], [place] [address]   Culture, blood (routine x 2)     Status: None (Preliminary result)   Collection Time: 03/18/20  9:46 PM   Specimen: BLOOD RIGHT FOREARM  Result Value Ref Range Status   Specimen Description BLOOD RIGHT FOREARM  Final   Special Requests   Final    BOTTLES DRAWN AEROBIC AND ANAEROBIC Blood Culture adequate volume  Culture   Final    NO GROWTH 4 DAYS Performed at [facility], [address], [place], [place] [address]    Report Status PENDING  Incomplete  Culture, blood (routine x 2)     Status: None (Preliminary result)   Collection Time: 03/18/20 10:56 PM   Specimen: BLOOD LEFT HAND  Result Value Ref Range Status   Specimen Description BLOOD LEFT HAND  Final   Special Requests   Final    BOTTLES DRAWN AEROBIC AND ANAEROBIC Blood Culture adequate volume   Culture   Final    NO GROWTH 4 DAYS Performed at [facility], [address], [place], [place] [address]    Report Status PENDING  Incomplete     Labs: Basic Metabolic Panel: Recent Labs  Lab 03/18/20 1253 03/18/20 2256 03/19/20 0401 03/20/20 0612  NA 137 137  --  137  K 4.2 4.4  --  4.3  CL 105 106   --  105  CO2 25 24  --  24  GLUCOSE 197* 240*  --  177*  BUN 33* 30*  --  42*  CREATININE 1.71* 1.64*  --  2.03*  CALCIUM 8.3* 7.8*  --  8.2*  MG  --   --  1.3*  --   PHOS  --   --  3.6  --    Liver Function Tests: Recent Labs  Lab 03/18/20 1253 03/20/20 0612  AST 18 15  ALT 19 14  ALKPHOS 77 54  BILITOT 0.4 0.3  PROT 8.3* 6.5  ALBUMIN 3.2* 2.3*   Recent Labs  Lab 03/18/20 1253  LIPASE 29   No results for input(s): AMMONIA in the last 168 hours. CBC: Recent Labs  Lab 03/18/20 1253 03/18/20 2256 03/19/20 0401 03/20/20 0612  WBC 10.3 8.9 10.9* 9.1  NEUTROABS 7.4 6.7  --   --   HGB 9.0* 7.9* 8.2* 8.0*  HCT 29.8* 26.1* 28.1* 27.2*  MCV 87.1 87.6 87.8 88.0  PLT 222 198 193 195    BNP (last 3 results) Recent Labs    11/09/19 2235  BNP 136.0*    CBG: Recent Labs  Lab 03/21/20 1106 03/21/20 1651 03/21/20 2116 03/22/20 0744 03/22/20 1112  GLUCAP 255* 224* 183* 169* 188*    Signed:  Barton Dubois MD.  [facility] 03/22/2020, 1:12 PM

## 2020-03-23 LAB — CULTURE, BLOOD (ROUTINE X 2)
Culture: NO GROWTH
Culture: NO GROWTH
Special Requests: ADEQUATE
Special Requests: ADEQUATE

## 2020-08-09 ENCOUNTER — Encounter (HOSPITAL_COMMUNITY): Payer: Self-pay | Admitting: Emergency Medicine

## 2020-08-09 ENCOUNTER — Other Ambulatory Visit: Payer: Self-pay

## 2020-08-09 ENCOUNTER — Emergency Department (HOSPITAL_COMMUNITY)
Admission: EM | Admit: 2020-08-09 | Discharge: 2020-08-09 | Disposition: A | Discharge status: Home / Self Care | Payer: 59 | Attending: Emergency Medicine | Admitting: Emergency Medicine

## 2020-08-09 DIAGNOSIS — Z5321 Procedure and treatment not carried out due to patient leaving prior to being seen by health care provider: Secondary | ICD-10-CM | POA: Diagnosis not present

## 2020-08-09 DIAGNOSIS — R0602 Shortness of breath: Secondary | ICD-10-CM | POA: Insufficient documentation

## 2020-08-09 DIAGNOSIS — D649 Anemia, unspecified: Secondary | ICD-10-CM | POA: Insufficient documentation

## 2020-08-09 DIAGNOSIS — R079 Chest pain, unspecified: Secondary | ICD-10-CM | POA: Insufficient documentation

## 2020-08-09 NOTE — ED Triage Notes (Signed)
Pt c/o symptomatic anemia with shortness of breath for the past 3 days.  Pt is on 4L trach collar O2 at home.  Pt presents to the ED with a 20 G PIV in her Left FA placed at [facility] in [place]. They did not have the blood she needed and sent her here.  Pt starts to complain about chest pain in triage. ECG ordered.

## 2020-10-10 ENCOUNTER — Inpatient Hospital Stay: Admission: AD | Admit: 2020-10-10 | Payer: 59 | Source: Other Acute Inpatient Hospital | Admitting: Internal Medicine

## 2020-10-27 IMAGING — CT CT ABD-PELV W/ CM
2 of 5 series · 16 of 46 positions shown, 18 images · IV contrast (Omnipaque or Isovue)
Comparison: 07/05/2017 renal ultrasound. 07/04/2017 abdominopelvic
CT.

CLINICAL DATA: Vomiting.  Weakness.  COPD.  Hysterectomy.

EXAM:
CT ABDOMEN AND PELVIS WITH CONTRAST
TECHNIQUE: Multidetector CT imaging of the abdomen and pelvis was performed
using the standard protocol following bolus administration of
intravenous contrast.
CONTRAST:  75mL OMNIPAQUE IOHEXOL 300 MG/ML  SOLN

[Series 2: axial st · axial · 0.77mm/px · z∈[+437,+832]mm · 13 of 91 slices shown, 15 images]
[im 6/91  soft-tissue]
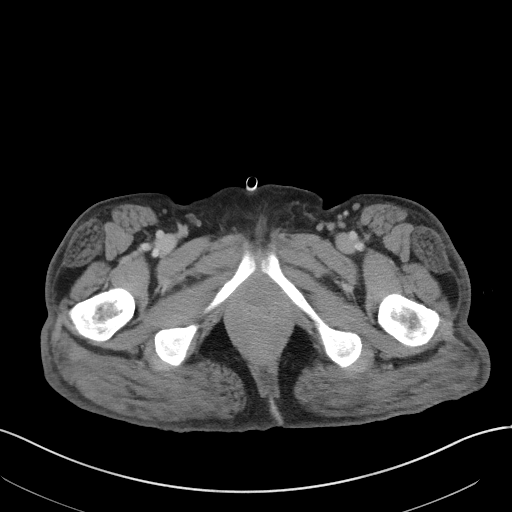
[im 6/91  bone]
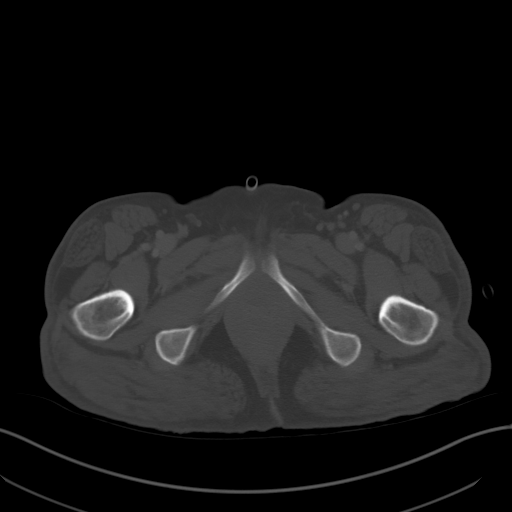
[im 12/91  soft-tissue]
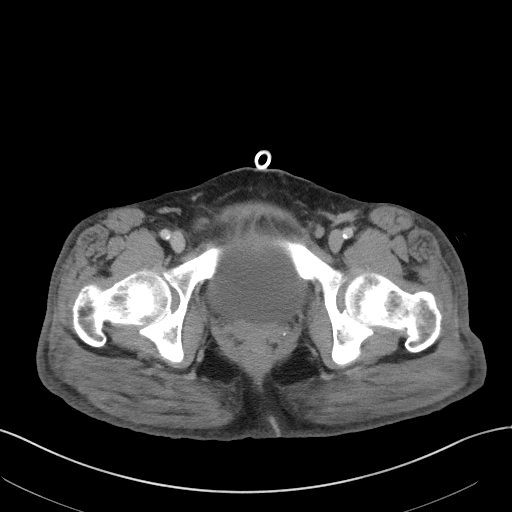
[im 17/91  soft-tissue]
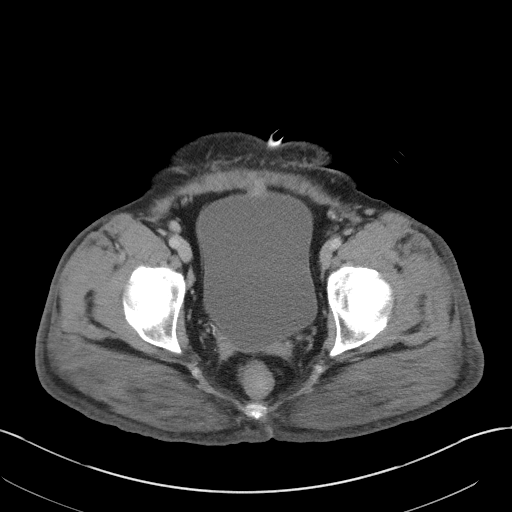
[im 29/91  soft-tissue]
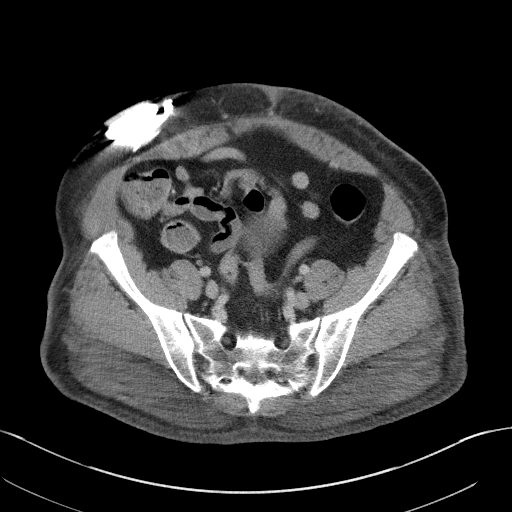
[im 34/91  soft-tissue]
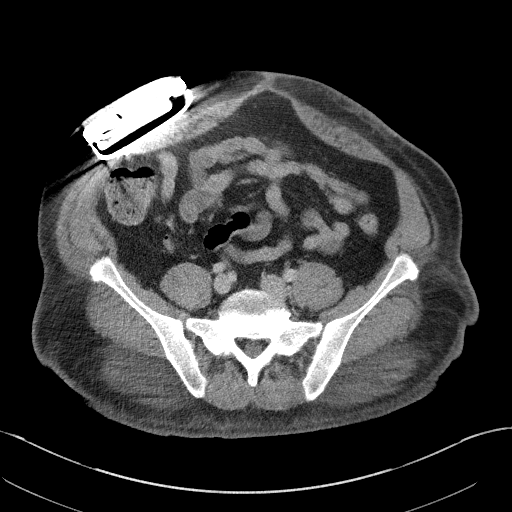
[im 40/91  soft-tissue]
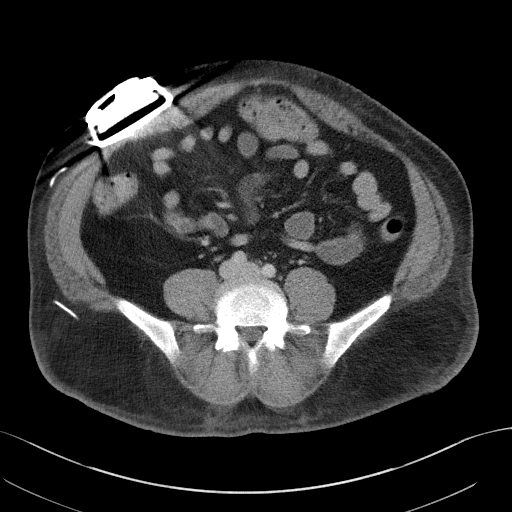
[im 46/91  soft-tissue]
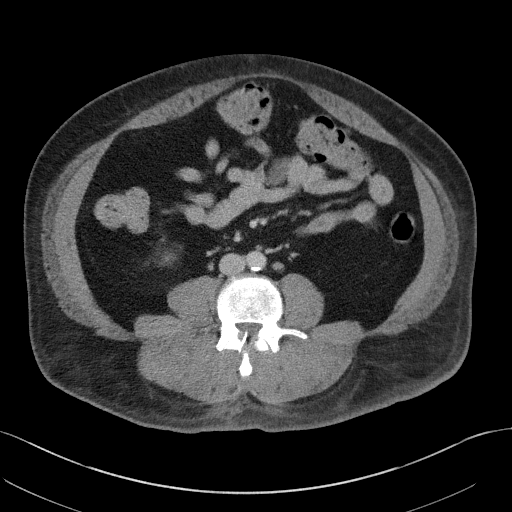
[im 51/91  soft-tissue]
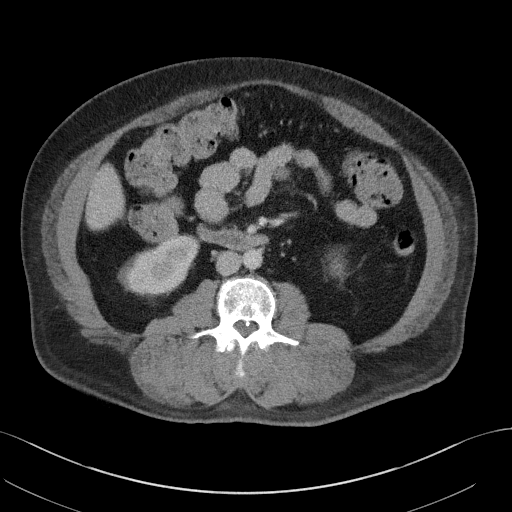
[im 57/91  soft-tissue]
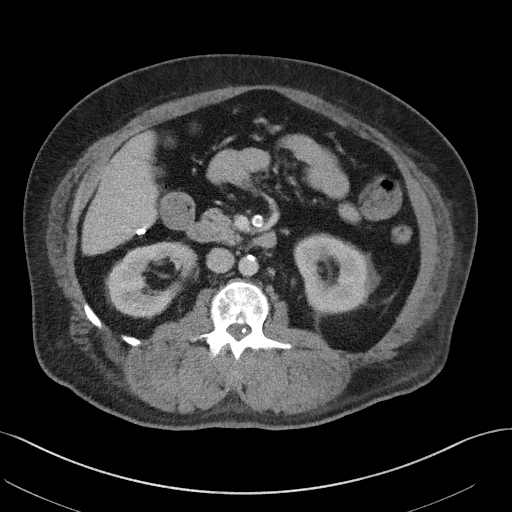
[im 57/91  bone]
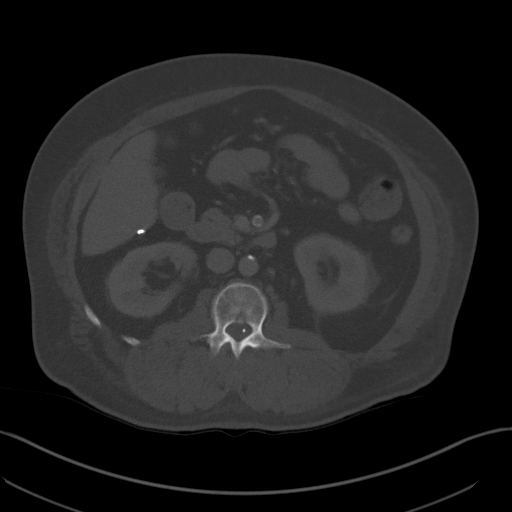
[im 62/91  soft-tissue]
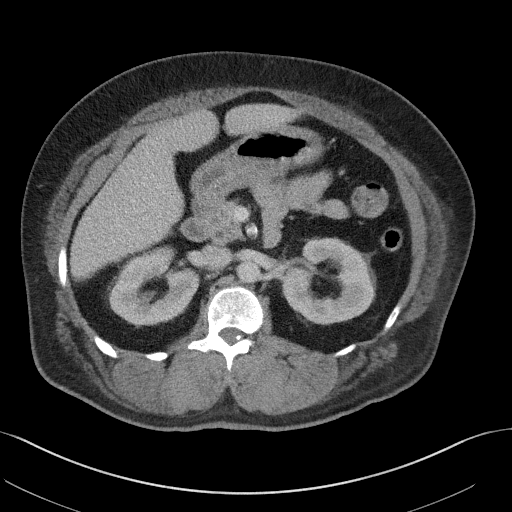
[im 74/91  soft-tissue]
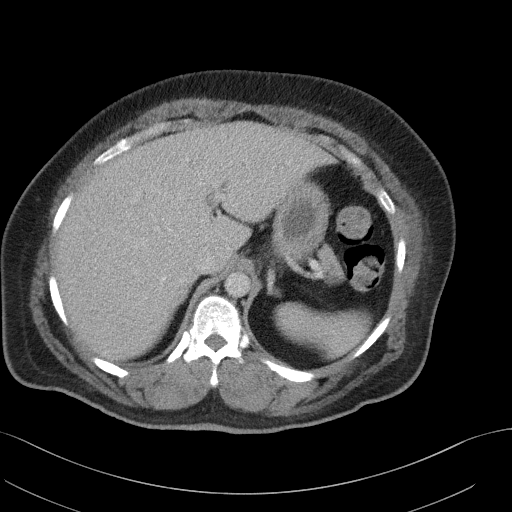
[im 79/91  soft-tissue]
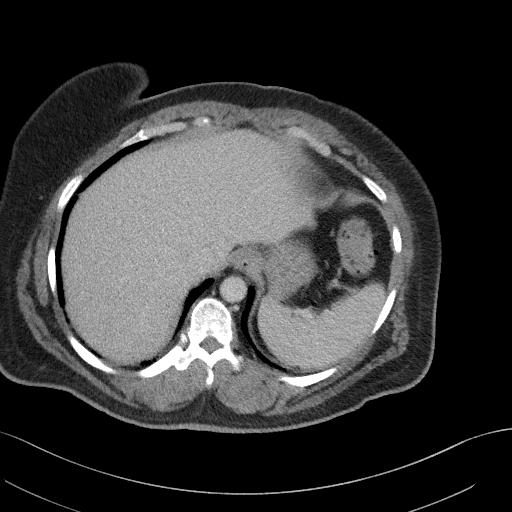
[im 85/91  soft-tissue]
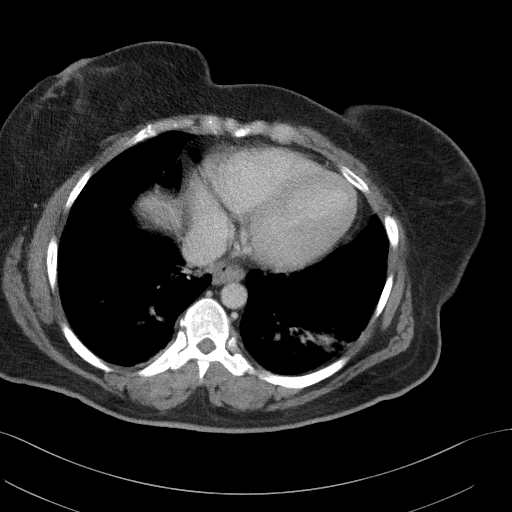

[Series 6: coronal st · coronal · 0.79mm/px · 3 of 116 slices shown]
[im 39/116  soft-tissue]
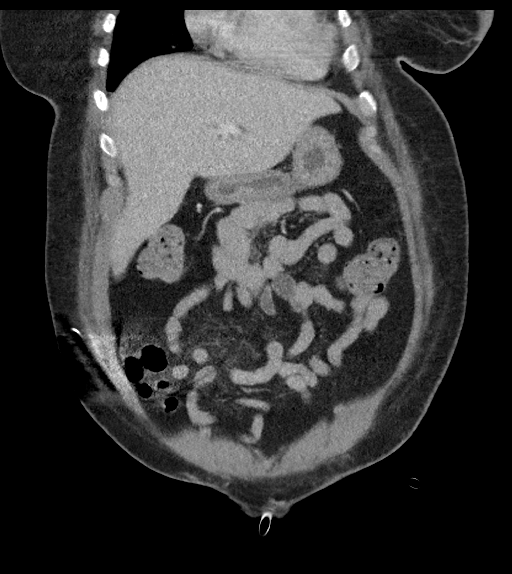
[im 52/116  soft-tissue]
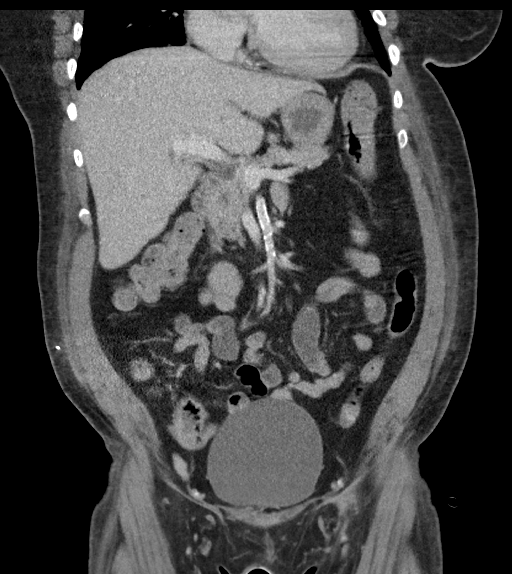
[im 64/116  soft-tissue]
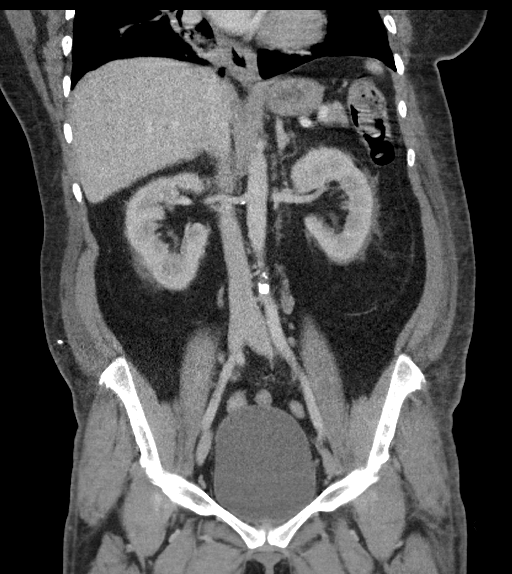

[16 of 46 positions shown; findings below may reference images not displayed]

FINDINGS: Lower chest: Left greater than right base airspace disease. Mild
cardiomegaly.

Hepatobiliary: Minimal motion degradation throughout the abdomen.
Normal liver. Cholecystectomy, without biliary ductal dilatation.

Pancreas: Normal, without mass or ductal dilatation.

Spleen: Normal in size, without focal abnormality.

Adrenals/Urinary Tract: Normal adrenal glands. Normal kidneys,
without hydronephrosis. Normal urinary bladder.

Stomach/Bowel: Gastric antral underdistention. Colonic stool burden
suggests constipation. Normal terminal ileum. Diminutive appendix.
Normal small bowel.

Vascular/Lymphatic: Aortic atherosclerosis. 1.0 cm right inguinal
node is unchanged. 1.0 cm left inguinal node is decreased from
cm on the prior.

Reproductive: Hysterectomy.  No adnexal mass.

Other: No significant free fluid. No free intraperitoneal air.
Anterior abdominal wall battery pack for dorsal spinal stimulator,
causing mild beam hardening artifact.

Musculoskeletal: Left and possible right-sided femoral head
avascular necrosis.
IMPRESSION: 1. No acute process in the abdomen or pelvis.
2. Possible constipation.
3. Left greater than right base airspace disease, most consistent
with infection or aspiration.
4. Aortic Atherosclerosis ([id number]).
5. Left and probable right-sided femoral head avascular necrosis.

## 2021-12-21 ENCOUNTER — Observation Stay (HOSPITAL_COMMUNITY)
Admission: EM | Admit: 2021-12-21 | Discharge: 2021-12-23 | Disposition: A | Discharge status: Home / Self Care | Payer: 59 | Attending: Internal Medicine | Admitting: Internal Medicine

## 2021-12-21 ENCOUNTER — Other Ambulatory Visit: Payer: Self-pay

## 2021-12-21 ENCOUNTER — Encounter (HOSPITAL_COMMUNITY): Payer: Self-pay

## 2021-12-21 ENCOUNTER — Emergency Department (HOSPITAL_COMMUNITY): Payer: 59

## 2021-12-21 DIAGNOSIS — D62 Acute posthemorrhagic anemia: Secondary | ICD-10-CM | POA: Diagnosis not present

## 2021-12-21 DIAGNOSIS — Z930 Tracheostomy status: Secondary | ICD-10-CM | POA: Insufficient documentation

## 2021-12-21 DIAGNOSIS — N185 Chronic kidney disease, stage 5: Secondary | ICD-10-CM | POA: Diagnosis not present

## 2021-12-21 DIAGNOSIS — G4733 Obstructive sleep apnea (adult) (pediatric): Secondary | ICD-10-CM | POA: Insufficient documentation

## 2021-12-21 DIAGNOSIS — Z794 Long term (current) use of insulin: Secondary | ICD-10-CM | POA: Diagnosis not present

## 2021-12-21 DIAGNOSIS — Z79899 Other long term (current) drug therapy: Secondary | ICD-10-CM

## 2021-12-21 DIAGNOSIS — R040 Epistaxis: Principal | ICD-10-CM

## 2021-12-21 DIAGNOSIS — F32A Depression, unspecified: Secondary | ICD-10-CM | POA: Diagnosis present

## 2021-12-21 DIAGNOSIS — E1165 Type 2 diabetes mellitus with hyperglycemia: Secondary | ICD-10-CM | POA: Diagnosis not present

## 2021-12-21 DIAGNOSIS — D631 Anemia in chronic kidney disease: Secondary | ICD-10-CM | POA: Insufficient documentation

## 2021-12-21 DIAGNOSIS — R0789 Other chest pain: Secondary | ICD-10-CM | POA: Diagnosis present

## 2021-12-21 DIAGNOSIS — J9611 Chronic respiratory failure with hypoxia: Secondary | ICD-10-CM | POA: Diagnosis not present

## 2021-12-21 DIAGNOSIS — E669 Obesity, unspecified: Secondary | ICD-10-CM | POA: Diagnosis present

## 2021-12-21 DIAGNOSIS — Z6831 Body mass index (BMI) 31.0-31.9, adult: Secondary | ICD-10-CM | POA: Diagnosis not present

## 2021-12-21 DIAGNOSIS — D649 Anemia, unspecified: Secondary | ICD-10-CM

## 2021-12-21 DIAGNOSIS — I5032 Chronic diastolic (congestive) heart failure: Secondary | ICD-10-CM | POA: Diagnosis present

## 2021-12-21 DIAGNOSIS — I132 Hypertensive heart and chronic kidney disease with heart failure and with stage 5 chronic kidney disease, or end stage renal disease: Secondary | ICD-10-CM | POA: Diagnosis not present

## 2021-12-21 DIAGNOSIS — Z9981 Dependence on supplemental oxygen: Secondary | ICD-10-CM | POA: Diagnosis not present

## 2021-12-21 DIAGNOSIS — Z86718 Personal history of other venous thrombosis and embolism: Secondary | ICD-10-CM

## 2021-12-21 DIAGNOSIS — E1122 Type 2 diabetes mellitus with diabetic chronic kidney disease: Secondary | ICD-10-CM | POA: Insufficient documentation

## 2021-12-21 DIAGNOSIS — Z7951 Long term (current) use of inhaled steroids: Secondary | ICD-10-CM

## 2021-12-21 DIAGNOSIS — R091 Pleurisy: Secondary | ICD-10-CM

## 2021-12-21 DIAGNOSIS — R079 Chest pain, unspecified: Secondary | ICD-10-CM | POA: Diagnosis present

## 2021-12-21 DIAGNOSIS — Z8673 Personal history of transient ischemic attack (TIA), and cerebral infarction without residual deficits: Secondary | ICD-10-CM | POA: Diagnosis not present

## 2021-12-21 DIAGNOSIS — Z7901 Long term (current) use of anticoagulants: Secondary | ICD-10-CM | POA: Insufficient documentation

## 2021-12-21 DIAGNOSIS — I10 Essential (primary) hypertension: Secondary | ICD-10-CM | POA: Diagnosis not present

## 2021-12-21 DIAGNOSIS — R300 Dysuria: Secondary | ICD-10-CM | POA: Diagnosis not present

## 2021-12-21 DIAGNOSIS — M6281 Muscle weakness (generalized): Secondary | ICD-10-CM | POA: Diagnosis not present

## 2021-12-21 LAB — CBC
HCT: 23.1 % — ABNORMAL LOW (ref 36.0–46.0)
HCT: 24.3 % — ABNORMAL LOW (ref 36.0–46.0)
Hemoglobin: 7.4 g/dL — ABNORMAL LOW (ref 12.0–15.0)
Hemoglobin: 7.4 g/dL — ABNORMAL LOW (ref 12.0–15.0)
MCH: 27.1 pg (ref 26.0–34.0)
MCH: 26.6 pg (ref 26.0–34.0)
MCHC: 32 g/dL (ref 30.0–36.0)
MCHC: 30.5 g/dL (ref 30.0–36.0)
MCV: 84.6 fL (ref 80.0–100.0)
MCV: 87.4 fL (ref 80.0–100.0)
Platelets: 146 10*3/uL — ABNORMAL LOW (ref 150–400)
Platelets: 129 10*3/uL — ABNORMAL LOW (ref 150–400)
RBC: 2.73 MIL/uL — ABNORMAL LOW (ref 3.87–5.11)
RBC: 2.78 MIL/uL — ABNORMAL LOW (ref 3.87–5.11)
RDW: 14.1 % (ref 11.5–15.5)
RDW: 14 % (ref 11.5–15.5)
WBC: 8.5 10*3/uL (ref 4.0–10.5)
WBC: 7.1 10*3/uL (ref 4.0–10.5)
nRBC: 0 % (ref 0.0–0.2)
nRBC: 0.3 % — ABNORMAL HIGH (ref 0.0–0.2)

## 2021-12-21 LAB — BASIC METABOLIC PANEL
Anion gap: 11 (ref 5–15)
BUN: 50 mg/dL — ABNORMAL HIGH (ref 6–20)
CO2: 31 mmol/L (ref 22–32)
Calcium: 7.7 mg/dL — ABNORMAL LOW (ref 8.9–10.3)
Chloride: 98 mmol/L (ref 98–111)
Creatinine, Ser: 5.64 mg/dL — ABNORMAL HIGH (ref 0.44–1.00)
GFR, Estimated: 8 mL/min — ABNORMAL LOW (ref >60)
Glucose, Bld: 166 mg/dL — ABNORMAL HIGH (ref 70–99)
Potassium: 4.2 mmol/L (ref 3.5–5.1)
Sodium: 140 mmol/L (ref 135–145)

## 2021-12-21 LAB — URINALYSIS, COMPLETE (UACMP) WITH MICROSCOPIC
Bacteria, UA: NONE SEEN
Bilirubin Urine: NEGATIVE
Glucose, UA: 150 mg/dL — AB
Ketones, ur: NEGATIVE mg/dL
Leukocytes,Ua: NEGATIVE
Nitrite: NEGATIVE
Protein, ur: 100 mg/dL — AB
RBC / HPF: >50 RBC/hpf — ABNORMAL HIGH (ref 0–5)
Specific Gravity, Urine: 1.009 (ref 1.005–1.030)
pH: 9 — ABNORMAL HIGH (ref 5.0–8.0)

## 2021-12-21 LAB — PROTIME-INR
INR: 1.4 — ABNORMAL HIGH (ref 0.8–1.2)
Prothrombin Time: 16.8 seconds — ABNORMAL HIGH (ref 11.4–15.2)

## 2021-12-21 LAB — TROPONIN I (HIGH SENSITIVITY): Troponin I (High Sensitivity): 8 ng/L (ref <18)

## 2021-12-21 LAB — CBG MONITORING, ED: Glucose-Capillary: 111 mg/dL — ABNORMAL HIGH (ref 70–99)

## 2021-12-21 LAB — HIV ANTIBODY (ROUTINE TESTING W REFLEX): HIV Screen 4th Generation wRfx: NONREACTIVE

## 2021-12-21 MED ORDER — PANTOPRAZOLE SODIUM 40 MG PO TBEC
40.0000 mg | DELAYED_RELEASE_TABLET | Freq: Every morning | ORAL | Status: DC
  Administered 2021-12-22 – 2021-12-23 (×2): 40 mg via ORAL
  Filled 2021-12-21 (×2): qty 1

## 2021-12-21 MED ORDER — ACETAMINOPHEN 325 MG PO TABS: 650.0000 mg | ORAL_TABLET | Freq: Four times a day (QID) | ORAL | Status: DC | PRN

## 2021-12-21 MED ORDER — MIRTAZAPINE 15 MG PO TABS
15.0000 mg | ORAL_TABLET | Freq: Every day | ORAL | Status: DC
  Administered 2021-12-21 – 2021-12-22 (×2): 15 mg via ORAL
  Filled 2021-12-21 (×2): qty 1

## 2021-12-21 MED ORDER — CALCITRIOL 0.25 MCG PO CAPS
0.2500 ug | ORAL_CAPSULE | Freq: Every day | ORAL | Status: DC
  Administered 2021-12-21 – 2021-12-23 (×3): 0.25 ug via ORAL
  Filled 2021-12-21 (×3): qty 1

## 2021-12-21 MED ORDER — FUROSEMIDE 40 MG PO TABS
40.0000 mg | ORAL_TABLET | Freq: Every morning | ORAL | Status: DC
Start: 2021-12-22 — End: 2021-12-22
  Administered 2021-12-22: 40 mg via ORAL
  Filled 2021-12-21: qty 1

## 2021-12-21 MED ORDER — VENLAFAXINE HCL ER 75 MG PO CP24
150.0000 mg | ORAL_CAPSULE | Freq: Every evening | ORAL | Status: DC
  Administered 2021-12-21 – 2021-12-22 (×2): 150 mg via ORAL
  Filled 2021-12-21: qty 2
  Filled 2021-12-21: qty 4

## 2021-12-21 MED ORDER — NIFEDIPINE ER OSMOTIC RELEASE 60 MG PO TB24
90.0000 mg | ORAL_TABLET | Freq: Every day | ORAL | Status: DC
  Administered 2021-12-21 – 2021-12-23 (×3): 90 mg via ORAL
  Filled 2021-12-21: qty 1
  Filled 2021-12-21: qty 3
  Filled 2021-12-21: qty 1

## 2021-12-21 MED ORDER — ONDANSETRON HCL 4 MG PO TABS: 4.0000 mg | ORAL_TABLET | Freq: Four times a day (QID) | ORAL | Status: DC | PRN

## 2021-12-21 MED ORDER — CLONIDINE HCL 0.1 MG PO TABS
0.1000 mg | ORAL_TABLET | Freq: Three times a day (TID) | ORAL | Status: DC
  Administered 2021-12-21 – 2021-12-23 (×6): 0.1 mg via ORAL
  Filled 2021-12-21 (×6): qty 1

## 2021-12-21 MED ORDER — IPRATROPIUM-ALBUTEROL 0.5-2.5 (3) MG/3ML IN SOLN: 3.0000 mL | Freq: Three times a day (TID) | RESPIRATORY_TRACT | Status: DC | PRN

## 2021-12-21 MED ORDER — HYDRALAZINE HCL 20 MG/ML IJ SOLN: 20.0000 mg | Freq: Once | INTRAMUSCULAR | Status: DC

## 2021-12-21 MED ORDER — TRANEXAMIC ACID FOR EPISTAXIS
500.0000 mg | Freq: Once | TOPICAL | Status: AC
  Administered 2021-12-21: 500 mg via TOPICAL
  Filled 2021-12-21: qty 10

## 2021-12-21 MED ORDER — SEVELAMER CARBONATE 800 MG PO TABS
800.0000 mg | ORAL_TABLET | Freq: Three times a day (TID) | ORAL | Status: DC
Start: 2021-12-21 — End: 2021-12-23
  Administered 2021-12-21 – 2021-12-23 (×4): 800 mg via ORAL
  Filled 2021-12-21 (×4): qty 1

## 2021-12-21 MED ORDER — CLONIDINE HCL 0.2 MG PO TABS
0.2000 mg | ORAL_TABLET | Freq: Once | ORAL | Status: AC
  Administered 2021-12-21: 0.2 mg via ORAL
  Filled 2021-12-21: qty 1

## 2021-12-21 MED ORDER — ONDANSETRON HCL 4 MG/2ML IJ SOLN: 4.0000 mg | Freq: Four times a day (QID) | INTRAMUSCULAR | Status: DC | PRN

## 2021-12-21 MED ORDER — AMITRIPTYLINE HCL 50 MG PO TABS
50.0000 mg | ORAL_TABLET | Freq: Every day | ORAL | Status: DC
  Administered 2021-12-21: 50 mg via ORAL
  Filled 2021-12-21: qty 1

## 2021-12-21 MED ORDER — HYDROCODONE-ACETAMINOPHEN 5-325 MG PO TABS
2.0000 | ORAL_TABLET | Freq: Once | ORAL | Status: AC
  Administered 2021-12-21: 2 via ORAL
  Filled 2021-12-21: qty 2

## 2021-12-21 MED ORDER — ISOSORBIDE MONONITRATE ER 30 MG PO TB24
30.0000 mg | ORAL_TABLET | Freq: Three times a day (TID) | ORAL | Status: DC
  Administered 2021-12-21 – 2021-12-23 (×6): 30 mg via ORAL
  Filled 2021-12-21 (×6): qty 1

## 2021-12-21 MED ORDER — CARVEDILOL 25 MG PO TABS
25.0000 mg | ORAL_TABLET | Freq: Two times a day (BID) | ORAL | Status: DC
  Administered 2021-12-21 – 2021-12-23 (×4): 25 mg via ORAL
  Filled 2021-12-21 (×2): qty 1
  Filled 2021-12-21: qty 2
  Filled 2021-12-21: qty 1

## 2021-12-21 MED ORDER — ACETAMINOPHEN 650 MG RE SUPP: 650.0000 mg | Freq: Four times a day (QID) | RECTAL | Status: DC | PRN

## 2021-12-21 MED ORDER — HYDRALAZINE HCL 25 MG PO TABS
100.0000 mg | ORAL_TABLET | Freq: Three times a day (TID) | ORAL | Status: DC
  Administered 2021-12-21 – 2021-12-23 (×6): 100 mg via ORAL
  Filled 2021-12-21 (×6): qty 4

## 2021-12-21 MED ORDER — LIDOCAINE-EPINEPHRINE (PF) 2 %-1:200000 IJ SOLN
INTRAMUSCULAR | Status: AC
  Filled 2021-12-21: qty 20

## 2021-12-21 MED ORDER — LORAZEPAM 1 MG PO TABS
1.0000 mg | ORAL_TABLET | Freq: Once | ORAL | Status: AC
  Administered 2021-12-21: 1 mg via ORAL
  Filled 2021-12-21: qty 1

## 2021-12-21 MED ORDER — ATORVASTATIN CALCIUM 80 MG PO TABS
80.0000 mg | ORAL_TABLET | Freq: Every day | ORAL | Status: DC
  Administered 2021-12-21 – 2021-12-22 (×2): 80 mg via ORAL
  Filled 2021-12-21: qty 1
  Filled 2021-12-21: qty 2

## 2021-12-21 NOTE — Assessment & Plan Note (Addendum)
ENT, Dr. Janace Hoard consulted -- Please contact him after transfer to [facility] Hold apixaban Monitor hemoglobin Question platelet dysfunction with CKD V

## 2021-12-21 NOTE — H&P (Addendum)
History and Physical    Patient: Joy Yoder MHD:[id number] DOB: [birth year] DOA: 12/21/2021 DOS: the patient was seen and examined on 12/21/2021 PCP: System, Provider Not In  Patient coming from: Home  Chief Complaint:  Chief Complaint  Patient presents with   Chest Pain    nosebleed   HPI: Secilia Apps is a 56 year old female with a history of CKD 5, chronic respiratory failure on 4 L with tracheostomy, hypertension, chronic chest pain, depression, lower extremity DVT, OSA, and CHF presenting with recurrent epistaxis and chest pain.  Regarding the patient's chest pain, the patient states that it has been chronic for over the last 6 months.  She states that she has had chest pain on a daily basis.  Her CP been attributable in part due to her poorly controlled hypertension as well as her intermittent episodes of acute on chronic anemia.  The patient denies any worsening shortness of breath.  She states that her chest pain is aggravated by any movement as well as palpation.  She denies any coughing or hemoptysis, fevers, chills.  In addition, the patient endorses compliance with her apixaban.  She states that she has not missed any doses. Nevertheless, the patient has been struggling with epistaxis for the past 1 to 2 weeks.  She states that it has been intermittent and mild.  She went to the ED in [place] on 12/17/2021 because of generalized weakness and nausea.  She was diagnosed with a UTI and started on cephalexin.  She went back to the ED on 12/18/2021 with similar symptoms.  She was given a unit of PRBC.  She stated that her epistaxis had worsened since then.  She denies any digital manipulation of her nose, but states that she has been using a nasal cannula oxygen supplementation instead of blow-by oxygen through her trach collar.  She states that she has been blowing her nose a lot.  She denies any fevers, chills, headache, or worsening shortness of breath.  Review the  medical record also shows that the patient has baseline CKD stage V with baseline creatinine 5.6-6.10.  She has been following [facility] in [place].  She is supposed to be taking furosemide 40 mg daily and metolazone 2.5 mg every other day.  She endorses compliance.  However she notes that she has been more nauseous over the past week with poor appetite and generalized weakness.  In the ED, the patient was afebrile and hypertensive up to 227/76.  Oxygen saturation was 96% on FiO2 28% with blow-by trach collar.  WBC 7.1, hemoglobin 7.4, platelets 129,000.  BMP showed sodium 140, potassium 4.2, bicarbonate 31, serum creatinine 5.64.  Chest x-ray showed increased vascular congestion.  There is no consolidations. Aerosolized TXA and nasal packing were used to help control her epistaxis.  However, after the packing was removed, the patient continued to have a slight amount of epistaxis once again.  There was concern that the patient would not be able to tolerate a Rhino Rocket due to her chronic respiratory failure requiring oxygen supplementation.  Because of her history of DVT requiring anticoagulation and continued epistaxis and acute blood loss anemia, ENT, Dr. Barry Dienes was consulted.  Therefore the patient will be transferred to [facility] for [facility] evaluation.  I also contacted nephrology regarding consultation for her CKD 5 and possible need for initiation of dialysis.  Review of Systems: As mentioned in the history of present illness. All other systems reviewed and are negative. Past Medical History:  Diagnosis  Date   Acute on chronic respiratory failure with hypoxia (HCC)    Anxiety    CHF (congestive heart failure) (HCC)    Chronic diastolic heart failure (HCC)    Chronic kidney disease, stage III (moderate) (HCC)    COPD, severe (HCC)    DDD (degenerative disc disease)    DDD (degenerative disc disease), thoracic    chest to back, legs,    Diabetes mellitus    DJD (degenerative joint  disease)    Hypertension    Kidney disease    stage 5   Lobar pneumonia ([place])    Neuropathy    Stroke [facility])    Past Surgical History:  Procedure Laterality Date   ABDOMINAL HYSTERECTOMY     bladder tack     CHOLECYSTECTOMY     EYE SURGERY     THYROID SURGERY     Social History:  reports that she has never smoked. She has never used smokeless tobacco. She reports that she does not drink alcohol and does not use drugs.  Allergies  Allergen Reactions   Metformin And Related Diarrhea   Nsaids     Kidney disease   Pravastatin Swelling   Reglan [Metoclopramide]     shakes   Tramadol Itching   Vancomycin Other (See Comments)    Kidney failure Kidney failure     No family history on file.  Prior to Admission medications   Medication Sig Start Date End Date Taking? Authorizing Provider  apixaban (ELIQUIS) 5 MG TABS tablet Take 5 mg by mouth 2 (two) times daily.   Yes [provider]  calcitRIOL (ROCALTROL) 0.25 MCG capsule Take 0.25 mcg by mouth daily.   Yes [provider]  cephALEXin (KEFLEX) 500 MG capsule Take 500 mg by mouth 2 (two) times daily.   Yes [provider]  cloNIDine (CATAPRES) 0.1 MG tablet Take 0.1 mg by mouth 3 (three) times daily.   Yes [provider]  NIFEdipine (ADALAT CC) 90 MG 24 hr tablet Take 90 mg by mouth daily.   Yes [provider]  sevelamer (RENAGEL) 800 MG tablet Take 800 mg by mouth 3 (three) times daily with meals.   Yes [provider]  albuterol (PROVENTIL) (2.5 MG/3ML) 0.083% nebulizer solution Take 2.5 mg by nebulization 3 (three) times daily as needed. 12/27/19   [provider]  albuterol (VENTOLIN HFA) 108 (90 Base) MCG/ACT inhaler Inhale 2 puffs into the lungs every 6 (six) hours. 12/27/19   [provider]  amitriptyline (ELAVIL) 25 MG tablet Take 50 mg by mouth at bedtime.    [provider]  amLODipine (NORVASC) 5 MG tablet Take 1.5 tablets (7.5 mg total)  by mouth daily. 03/22/20   Barton Dubois, MD  atorvastatin (LIPITOR) 80 MG tablet Take 80 mg by mouth daily at 6 PM.    [provider]  carvedilol (COREG) 25 MG tablet Take 25 mg by mouth 2 (two) times daily with a meal.     [provider]  clopidogrel (PLAVIX) 75 MG tablet Take 1 tablet (75 mg total) by mouth daily. 03/22/20   Barton Dubois, MD  cyclobenzaprine (FLEXERIL) 10 MG tablet Take 10 mg by mouth 3 (three) times daily as needed for muscle spasms.    [provider]  dextromethorphan-guaiFENesin (MUCINEX DM) 30-600 MG 12hr tablet Take 1 tablet by mouth 2 (two) times daily. 03/22/20   Barton Dubois, MD  furosemide (LASIX) 20 MG tablet Take 40 mg by mouth in the  morning.    [provider]  gabapentin (NEURONTIN) 600 MG tablet Take 300 mg by mouth 3 (three) times daily.    [provider]  hydrALAZINE (APRESOLINE) 100 MG tablet Take 100 mg by mouth 3 (three) times daily.     [provider]  hydrOXYzine (ATARAX/VISTARIL) 50 MG tablet Take 50 mg by mouth in the morning and at bedtime.     [provider]  insulin lispro (HUMALOG) 100 UNIT/ML injection Inject 5-16 Units into the skin 3 (three) times daily before meals. Patient injects according to sliding scale at home.    [provider]  ipratropium-albuterol (DUONEB) 0.5-2.5 (3) MG/3ML SOLN Inhale 3 mLs into the lungs 3 (three) times daily as needed. 12/27/19   [provider]  isosorbide mononitrate (IMDUR) 30 MG 24 hr tablet Take 30 mg by mouth 3 (three) times daily.    [provider]  LANTUS SOLOSTAR 100 UNIT/ML Solostar Pen Inject 50 Units into the skin at bedtime.  10/19/19   [provider]  mirtazapine (REMERON) 15 MG tablet Take 15 mg by mouth at bedtime as needed. 03/07/20   [provider]  ondansetron (ZOFRAN-ODT) 4 MG disintegrating tablet Take 1 tablet by mouth daily as needed. 01/16/20   [provider]  OneTouch  Delica Lancets 16W MISC Apply 1 each topically in the morning and at bedtime. 03/07/20   [provider]  [facility] VERIO test strip 1 each by Other route in the morning and at bedtime. 03/07/20   [provider]  pantoprazole (PROTONIX) 40 MG tablet Take 40 mg by mouth in the morning.    [provider]  sodium chloride 0.9 % nebulizer solution Take 1 mL by nebulization in the morning and at bedtime. 02/03/20   [provider]  venlafaxine XR (EFFEXOR-XR) 150 MG 24 hr capsule Take 150 mg by mouth every evening.     [provider]    Physical Exam: Vitals:   12/21/21 0835 12/21/21 0900 12/21/21 0930 12/21/21 1000  BP:  (!) 176/97 (!) 202/63 (!) 187/62  Pulse: 78 74 62 (!) 59  Resp: (!) 21 15 13  (!) 9  Temp:      TempSrc:      SpO2: 91% 91% 97% 100%  Weight:      Height:       GENERAL:  A&O x 3, NAD, well developed, cooperative, follows commands HEENT: [place]/AT, No thrush, No icterus, No oral ulcers Neck:  No neck mass, No meningismus, soft, supple CV: RRR, no S3, no S4, no rub, no JVD Lungs:  bibasilar crackles. No wheeze Abd: soft/NT +BS, nondistended Ext: trace LE edema, no lymphangitis, no cyanosis, no rashes Neuro:  CN II-XII intact, strength 4/5 in RUE, RLE, strength 4/5 LUE, LLE; sensation intact bilateral; no dysmetria; babinski equivocal  Data Reviewed: Data reviewed in history  Assessment and Plan: * Acute on chronic blood loss anemia Baseline hemoglobin 8-9 Presented with hemoglobin 7.4 Holding apixaban temporarily  History of deep venous thrombosis (DVT) of distal vein of left lower extremity Holding apixaban temporarily in the setting of acute blood loss anemia and epistaxis  Chronic respiratory failure with hypoxia (HCC) Chronically on 4 L nasal cannula at home Currently saturating well with blow-by trach collar, FiO2 28%  CKD (chronic kidney disease) stage 5, GFR less than 15 ml/min (HCC) Patient endorses worsening  nausea -Question uremic symptoms versus nausea from epistaxis Discussed case with nephrology, Dr. Carolin Sicks --They will consult after patient transferred  to [facility] Patient had fistula placed left arm 10/11/2021 Currently does not need urgent dialysis  Epistaxis ENT, Dr. Janace Hoard consulted -- Please contact him after transfer to [facility] apixaban Monitor hemoglobin Question platelet dysfunction with CKD V  Atypical chest pain Appears there is a major musculoskeletal component -Very reproducible on examination palpation -Very low clinical suspicion for PE -Cycle troponins  Chronic diastolic heart failure ([place]) Appears clinically euvolemic Continue home dose of furosemide and metolazone  Uncontrolled type 2 diabetes mellitus with hyperglycemia, with long-term current use of insulin (HCC) Repeat hemoglobin A1c NovoLog sliding scale Holding off on long-acting insulin temporarily and follow CBGs  Malignant hypertension Continue home dose of nifedipine, hydralazine, clonidine, and carvedilol -- There have been concerns regarding the patient's compliance.  The medical record      Advance Care Planning: FULL CODE  Consults: renal, ENT Janace Hoard  Family Communication: daughter updated 5/20  Severity of Illness: The appropriate patient status for this patient is INPATIENT. Inpatient status is judged to be reasonable and necessary in order to provide the required intensity of service to ensure the patient's safety. The patient's presenting symptoms, physical exam findings, and initial radiographic and laboratory data in the context of their chronic comorbidities is felt to place them at high risk for further clinical deterioration. Furthermore, it is not anticipated that the patient will be medically stable for discharge from the hospital within 2 midnights of admission.   * I certify that at the point of admission it is my clinical judgment that the patient will require inpatient  hospital care spanning beyond 2 midnights from the point of admission due to high intensity of service, high risk for further deterioration and high frequency of surveillance required.*  Author: Orson Eva, MD 12/21/2021 11:09 AM  For on call review [email or web address].

## 2021-12-21 NOTE — Assessment & Plan Note (Signed)
Holding apixaban temporarily in the setting of acute blood loss anemia and epistaxis

## 2021-12-21 NOTE — ED Provider Notes (Signed)
[facility] EMERGENCY DEPARTMENT Provider Note   CSN: [id number] Arrival date & time: 12/21/21  0017     History  Chief Complaint  Patient presents with   Chest Pain    nosebleed    Joy Yoder is a 56 y.o. female.  The history is provided by the patient.  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  Patient reports for the past several days she has had intermittent episodes of epistaxis mostly on the right nose.  She reports over the past 2 days she has had right-sided chest pain that is sharp in nature.  She reports it hurts to breathe.  She reports she gets this pain on a weekly basis of unclear etiology.  She reports medication compliance Patient reports multiple ER evaluations in [place] this week.  She reports she was diagnosed with a UTI recently and placed on antibiotics.  1 ER visit revealed she had worsening anemia and required blood transfusion.   Patient reports history of chronic respiratory failure and is on 4 L nasal cannula at home Home Medications Prior to Admission medications   Medication Sig Start Date End Date Taking? Authorizing Provider  apixaban (ELIQUIS) 5 MG TABS tablet Take 5 mg by mouth 2 (two) times daily.   Yes [provider]  calcitRIOL (ROCALTROL) 0.25 MCG capsule Take 0.25 mcg by mouth daily.   Yes [provider]  cephALEXin (KEFLEX) 500 MG capsule Take 500 mg by mouth 2 (two) times daily.   Yes [provider]  cloNIDine (CATAPRES) 0.1 MG tablet Take 0.1  mg by mouth 3 (three) times daily.   Yes [provider]  NIFEdipine (ADALAT CC) 90 MG 24 hr tablet Take 90 mg by mouth daily.   Yes [provider]  sevelamer (RENAGEL) 800 MG tablet Take 800 mg by mouth 3 (three) times daily with meals.   Yes [provider]  albuterol (PROVENTIL) (2.5 MG/3ML) 0.083% nebulizer solution Take 2.5 mg by nebulization 3 (three) times daily as needed. 12/27/19   [provider]  albuterol (VENTOLIN HFA) 108 (90 Base) MCG/ACT inhaler Inhale 2 puffs into the lungs every 6 (six) hours. 12/27/19   [provider]  amitriptyline (ELAVIL) 25 MG tablet Take 50 mg by mouth at bedtime.    [provider]  amLODipine (NORVASC) 5 MG tablet Take 1.5 tablets (7.5 mg total) by mouth daily. 03/22/20   Barton Dubois, MD  atorvastatin (LIPITOR) 80 MG tablet Take 80 mg by mouth daily at 6 PM.    [provider]  carvedilol (COREG) 25 MG tablet Take 25 mg by mouth 2 (two) times daily with a meal.     [provider]  clopidogrel (PLAVIX) 75 MG tablet Take 1 tablet (75 mg total) by mouth daily. 03/22/20   Barton Dubois, MD  cyclobenzaprine (FLEXERIL) 10 MG tablet Take 10 mg by mouth 3 (three) times daily as needed for muscle spasms.    [provider]  dextromethorphan-guaiFENesin (MUCINEX DM) 30-600 MG 12hr tablet Take 1 tablet by mouth 2 (two) times daily. 03/22/20  Barton Dubois, MD  furosemide (LASIX) 20 MG tablet Take 40 mg by mouth in the morning.    [provider]  gabapentin (NEURONTIN) 600 MG tablet Take 300 mg by mouth 3 (three) times daily.    [provider]  hydrALAZINE (APRESOLINE) 100 MG tablet Take 100 mg by mouth 3 (three) times daily.     [provider]  hydrOXYzine (ATARAX/VISTARIL) 50 MG tablet Take 50 mg by mouth in the morning and at bedtime.     [provider]  insulin lispro (HUMALOG) 100 UNIT/ML injection Inject 5-16 Units into the skin 3 (three)  times daily before meals. Patient injects according to sliding scale at home.    [provider]  ipratropium-albuterol (DUONEB) 0.5-2.5 (3) MG/3ML SOLN Inhale 3 mLs into the lungs 3 (three) times daily as needed. 12/27/19   [provider]  isosorbide mononitrate (IMDUR) 30 MG 24 hr tablet Take 30 mg by mouth 3 (three) times daily.    [provider]  LANTUS SOLOSTAR 100 UNIT/ML Solostar Pen Inject 50 Units into the skin at bedtime.  10/19/19   [provider]  mirtazapine (REMERON) 15 MG tablet Take 15 mg by mouth at bedtime as needed. 03/07/20   [provider]  ondansetron (ZOFRAN-ODT) 4 MG disintegrating tablet Take 1 tablet by mouth daily as needed. 01/16/20   [provider]  OneTouch Delica Lancets 87O MISC Apply 1 each topically in the morning and at bedtime. 03/07/20   [provider]  [facility] VERIO test strip 1 each by Other route in the morning and at bedtime. 03/07/20   [provider]  pantoprazole (PROTONIX) 40 MG tablet Take 40 mg by mouth in the morning.    [provider]  sodium chloride 0.9 % nebulizer solution Take 1 mL by nebulization in the morning and at bedtime. 02/03/20   [provider]  venlafaxine XR (EFFEXOR-XR) 150 MG 24 hr capsule Take 150 mg by mouth every evening.     [provider]      Allergies    Metformin and related, Nsaids, Pravastatin, Reglan [metoclopramide], Tramadol, and Vancomycin    Review of Systems   Review of Systems  Constitutional:  Negative for fever.  HENT:  Positive for nosebleeds.   Respiratory:  Positive for shortness of breath.   Cardiovascular:  Positive for chest pain.   Physical Exam Updated Vital Signs BP (!) 176/62   Pulse 61   Temp 98.4 F (36.9 C) (Oral)   Resp 16   Ht 1.575 m (5\' 2" )   Wt 77.6 kg   SpO2 96%   BMI 31.29 kg/m  Physical Exam CONSTITUTIONAL: Chronically ill-appearing, appears older than stated age HEAD:  Normocephalic/atraumatic EYES: EOMI/PERRL ENMT: Mucous membranes moist, dried blood noted in both nares.  No active bleeding Patient utilizing nasal cannula NECK: supple no meningeal signs, tracheostomy in place, no complication SPINE/BACK:entire spine nontender CV: S1/S2 noted, no loud or harsh murmurs LUNGS: Lungs are clear to auscultation bilaterally, no apparent distress ABDOMEN: soft, nontender, no rebound or guarding, bowel sounds noted throughout abdomen GU:no cva tenderness NEURO: Pt is awake/alert/appropriate, moves all extremitiesx4.  No facial droop.   EXTREMITIES: pulses normal/equal, full ROM, dialysis access noted to left arm with thrill noted Pulses x4 are equal.  No lower extremity edema SKIN: warm, color normal PSYCH: no abnormalities of mood noted, alert and oriented to situation  ED Results / Procedures / Treatments   Labs (all labs ordered are listed,  but only abnormal results are displayed) Labs Reviewed  BASIC METABOLIC PANEL - Abnormal; Notable for the following components:      Result Value   Glucose, Bld 166 (*)    BUN 50 (*)    Creatinine, Ser 5.64 (*)    Calcium 7.7 (*)    GFR, Estimated 8 (*)    All other components within normal limits  CBC - Abnormal; Notable for the following components:   RBC 2.73 (*)    Hemoglobin 7.4 (*)    HCT 23.1 (*)    Platelets 146 (*)    All other components within normal limits  PROTIME-INR - Abnormal; Notable for the following components:   Prothrombin Time 16.8 (*)    INR 1.4 (*)    All other components within normal limits  CBC - Abnormal; Notable for the following components:   RBC 2.78 (*)    Hemoglobin 7.4 (*)    HCT 24.3 (*)    Platelets 129 (*)    nRBC 0.3 (*)    All other components within normal limits  TROPONIN I (HIGH SENSITIVITY)    EKG EKG Interpretation  Date/Time:  Saturday Dec 21 2021 01:29:48 EDT Ventricular Rate:  72 PR Interval:  161 QRS Duration: 82 QT Interval:  440 QTC  Calculation: 482 R Axis:   12 Text Interpretation: Sinus rhythm Confirmed by Ripley Fraise [phone]) on 12/21/2021 1:31:27 AM  Radiology DG Chest Portable 1 View  Result Date: 12/21/2021 CLINICAL DATA:  Chest pain. EXAM: PORTABLE CHEST 1 VIEW COMPARISON:  Most recent available radiograph 03/21/2020 FINDINGS: Tracheostomy tube tip projects over the thoracic inlet. Lung volumes are low. Mild cardiomegaly which is similar to prior exam. Diffuse hazy opacity in the right hemithorax which may represent layering effusion. There is trace fluid in the right minor fissure. No confluent consolidation. No pneumothorax. No acute osseous abnormalities are seen. IMPRESSION: 1. Low lung volumes. Diffuse hazy opacity in the right hemithorax may represent layering effusion. Trace fluid in the right minor fissure. 2. Stable mild cardiomegaly. Electronically Signed   By: Keith Rake M.D.   On: 12/21/2021 01:57    Procedures .Epistaxis Management  Date/Time: 12/21/2021 2:56 AM Performed by: Ripley Fraise, MD Authorized by: Ripley Fraise, MD   Consent:    Consent obtained:  Verbal   Consent given by:  Patient   Alternatives discussed:  No treatment Anesthesia:    Anesthesia method:  Topical application   Topical anesthesia: Lidocaine with epinephrine. Procedure details:    Treatment site:  R anterior   Treatment method:  Silver nitrate   Treatment complexity:  Limited   Treatment episode: initial   Post-procedure details:    Assessment:  Bleeding decreased   Procedure completion:  Tolerated Comments:     Placed lidocaine with epipnephrine on gauze and placed in right nare, no complications I then utilized a silver nitrate stick on the right nare without complicate    Medications Ordered in ED Medications  HYDROcodone-acetaminophen (NORCO/VICODIN) 5-325 MG per tablet 2 tablet (2 tablets Oral Given 12/21/21 0134)  lidocaine-EPINEPHrine (XYLOCAINE W/EPI) 2 %-1:200000 (PF) injection (  Given  12/21/21 0346)  LORazepam (ATIVAN) tablet 1 mg (1 mg Oral Given 12/21/21 0328)    ED Course/ Medical Decision Making/ A&P Clinical Course as of 12/21/21 0627  Sat Dec 21, 2021  0121 Hemoglobin(!): 7.4 Chronic anemia noted [DW]  0223 Creatinine(!): 5.64 Chronic renal failure [DW]  0255 Patient improved and resting comfortably.  She reports that prior to her  last transfusion, her hemoglobin was in the 6 range.  She received 1 unit. [DW]  3382 At this time, I do not feel she needs emergent transfusion.  She feels improved.  Suspect some of her chest pain is due to the underlying pleural effusion.  Low suspicion for ACS/PE at this time.  She is compliant with her anticoagulation [DW]  0323 Patient improved.  She is most concerned about her epistaxis.  She had a minimal amount of bleeding here.  The bleeding has now stopped. [DW]  [phone] Family is concerned that she may need a blood transfusion, therefore we will monitor patient in the ER for several hours, recheck CBC later this morning and reassess.  Patient does not want to be admitted but is willing to have a brief observation in the emergency department [DW]  [phone] Patient has been stable throughout the stay in the emergency department.  No active bleeding.  Repeat CBC reveals stable hemoglobin. [DW]  [phone] Patient noted that her chest pain was an ongoing and chronic issue.  Initial chest pain work-up in the ED was unremarkable.  At this point patient is safe for discharge home. [DW]    Clinical Course User Index [DW] Ripley Fraise, MD                           Medical Decision Making Amount and/or Complexity of Data Reviewed Labs: ordered. Decision-making details documented in ED Course. Radiology: ordered. ECG/medicine tests: ordered.  Risk Prescription drug management.   This patient presents to the ED for concern of chest pain, this involves an extensive number of treatment options, and is a complaint that carries with it a high risk  of complications and morbidity.  The differential diagnosis includes but is not limited to acute coronary syndrome, aortic dissection, pulmonary embolism, pericarditis, pneumothorax, pneumonia, myocarditis, pleurisy, esophageal rupture    Comorbidities that complicate the patient evaluation: Patient's presentation is complicated by their history of chronic kidney disease, thromboembolism  Social Determinants of Health: Patient's  multiple chronic medical condition   increases the complexity of managing their presentation  Additional history obtained: Additional history obtained from family Records reviewed Care Everywhere/External Records  Lab Tests: I Ordered, and personally interpreted labs.  The pertinent results include: Acute on chronic anemia, chronic renal failure  Imaging Studies ordered: I ordered imaging studies including X-ray chest   I independently visualized and interpreted imaging which showed right pleural effusion I agree with the radiologist interpretation  Cardiac Monitoring: The patient was maintained on a cardiac monitor.  I personally viewed and interpreted the cardiac monitor which showed an underlying rhythm of:  sinus rhythm  Medicines ordered and prescription drug management: I ordered medication including Vicodin for pain Reevaluation of the patient after these medicines showed that the patient    improved   Reevaluation: After the interventions noted above, I reevaluated the patient and found that they have :improved  Complexity of problems addressed: Patient's presentation is most consistent with  acute presentation with potential threat to life or bodily function  Disposition: After consideration of the diagnostic results and the patient's response to treatment,  I feel that the patent would benefit from discharge   .           Final Clinical Impression(s) / ED Diagnoses Final diagnoses:  Epistaxis  Pleurisy  Chronic anemia    Rx /  DC Orders ED Discharge Orders     None  Ripley Fraise, MD 12/21/21 [phone]

## 2021-12-21 NOTE — ED Notes (Signed)
Pt suctioned, inner cannula cleaned

## 2021-12-21 NOTE — ED Notes (Signed)
Bleeding is still currently controlled.

## 2021-12-21 NOTE — Discharge Instructions (Addendum)
Follow with Primary MD family ENT physician in 7 days   Get CBC, CMP, UA -  checked next visit within 1 week by Primary MD    Activity: As tolerated with Full fall precautions use walker/cane & assistance as needed  Disposition Home    Diet: Renal-low carbohydrate diet with 1200 cc fluid restriction per day  Special Instructions: If you have smoked or chewed Tobacco  in the last 2 yrs please stop smoking, stop any regular Alcohol  and or any Recreational drug use.  On your next visit with your primary care physician please Get Medicines reviewed and adjusted.  Please request your Prim.MD to go over all Hospital Tests and Procedure/Radiological results at the follow up, please get all Hospital records sent to your Prim MD by signing hospital release before you go home.  If you experience worsening of your admission symptoms, develop shortness of breath, life threatening emergency, suicidal or homicidal thoughts you must seek medical attention immediately by calling 911 or calling your MD immediately  if symptoms less severe.  You Must read complete instructions/literature along with all the possible adverse reactions/side effects for all the Medicines you take and that have been prescribed to you. Take any new Medicines after you have completely understood and accpet all the possible adverse reactions/side effects.

## 2021-12-21 NOTE — Assessment & Plan Note (Signed)
Chronically on 4 L nasal cannula at home Currently saturating well with blow-by trach collar, FiO2 28%

## 2021-12-21 NOTE — ED Provider Notes (Signed)
At time of discharge, patient was noted to have another trickle of blood from her right nare.  This area was cleaned out with small amount of bleeding noted.  Patient otherwise stable. Patient will have very difficult time managing a nasal balloon due to use of nasal cannula.  I was able to atomize TXA about 39ml into right nare without difficulty.  Pt tolerated well.  She will hold pressure.  If no further bleeding she will be discharged.  Signed out to Dr. Jeanell Sparrow at shift change    Ripley Fraise, MD 12/21/21 [phone]

## 2021-12-21 NOTE — Hospital Course (Signed)
56 year old female with a history of CKD 5, chronic respiratory failure on 4 L with tracheostomy, hypertension, chronic chest pain, depression, lower extremity DVT, OSA, and CHF presenting with recurrent epistaxis and chest pain.  Regarding the patient's chest pain, the patient states that it has been chronic for over the last 6 months.  She states that she has had chest pain on a daily basis.  Her CP been attributable in part due to her poorly controlled hypertension as well as her intermittent episodes of acute on chronic anemia.  The patient denies any worsening shortness of breath.  She states that her chest pain is aggravated by any movement as well as palpation.  She denies any coughing or hemoptysis, fevers, chills.  In addition, the patient endorses compliance with her apixaban.  She states that she has not missed any doses. Nevertheless, the patient has been struggling with epistaxis for the past 1 to 2 weeks.  She states that it has been intermittent and mild.  She went to the ED in [place] on 12/17/2021 because of generalized weakness and nausea.  She was diagnosed with a UTI and started on cephalexin.  She went back to the ED on 12/18/2021 with similar symptoms.  She was given a unit of PRBC.  She stated that her epistaxis had worsened since then.  She denies any digital manipulation of her nose, but states that she has been using a nasal cannula oxygen supplementation instead of blow-by oxygen through her trach collar.  She states that she has been blowing her nose a lot.  She denies any fevers, chills, headache, or worsening shortness of breath.  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  She has been following [facility] in [place].  She is supposed to be taking furosemide 40 mg daily and metolazone 2.5 mg every other day.  She endorses compliance.  However she notes that she has been more nauseous over the past week  with poor appetite and generalized weakness.  In the ED, the patient was afebrile and hypertensive up to 227/76.  Oxygen saturation was 96% on FiO2 28% with blow-by trach collar.  WBC 7.1, hemoglobin 7.4, platelets 129,000.  BMP showed sodium 140, potassium 4.2, bicarbonate 31, serum creatinine 5.64.  Chest x-ray showed increased vascular congestion.  There is no consolidations. Aerosolized TXA and nasal packing were used to help control her epistaxis.  However, after the packing was removed, the patient continued to have a slight amount of epistaxis once again.  There was concern that the patient would not be able to tolerate a Rhino Rocket due to her chronic respiratory failure requiring oxygen supplementation.  Because of her history of DVT requiring anticoagulation and continued epistaxis and acute blood loss anemia, ENT, Dr. Barry Dienes was consulted.  Therefore the patient will be transferred to [facility] for [facility] evaluation.  I also contacted nephrology regarding consultation for her CKD 5 and possible need for initiation of dialysis.

## 2021-12-21 NOTE — ED Provider Notes (Addendum)
[facility] EMERGENCY DEPARTMENT Provider Note   CSN: [id number] Arrival date & time: 12/21/21  0017     History  Chief Complaint  Patient presents with   Chest Pain    nosebleed    Joy Yoder is a 56 y.o. female.  56 yo female with multiple health problems including chronic anticoagulation. Dr. Christy Gentles cauterized, there continues to be some trickling. Gauze with TXA in place.   Patient with trach and unable to tolerate except with nasal canula. ENT in [facility] Patient lives in [place] Will d/c if bleeding controlled. 9:20 AM Patient with some return of oozing. Will discuss with admit team and plan placement of right rhinorocket.  However, patient will need to be observed for oxygenation and bleeding.        Home Medications Prior to Admission medications   Medication Sig Start Date End Date Taking? Authorizing Provider  apixaban (ELIQUIS) 5 MG TABS tablet Take 5 mg by mouth 2 (two) times daily.   Yes [provider]  calcitRIOL (ROCALTROL) 0.25 MCG capsule Take 0.25 mcg by mouth daily.   Yes [provider]  cephALEXin (KEFLEX) 500 MG capsule Take 500 mg by mouth 2 (two) times daily.   Yes [provider]  cloNIDine (CATAPRES) 0.1 MG tablet Take 0.1 mg by mouth 3 (three) times daily.   Yes [provider]  NIFEdipine (ADALAT CC) 90 MG 24 hr tablet Take 90 mg by mouth daily.   Yes [provider]  sevelamer (RENAGEL) 800 MG tablet Take 800 mg by mouth 3 (three) times daily with meals.   Yes [provider]  albuterol (PROVENTIL) (2.5 MG/3ML) 0.083% nebulizer solution Take 2.5 mg by nebulization 3 (three) times daily as needed. 12/27/19   [provider]  albuterol (VENTOLIN HFA) 108 (90 Base) MCG/ACT inhaler Inhale 2 puffs into the lungs every 6 (six) hours. 12/27/19   [provider]  amitriptyline (ELAVIL) 25 MG tablet Take 50 mg by mouth at bedtime.    [provider]   amLODipine (NORVASC) 5 MG tablet Take 1.5 tablets (7.5 mg total) by mouth daily. 03/22/20   Barton Dubois, MD  atorvastatin (LIPITOR) 80 MG tablet Take 80 mg by mouth daily at 6 PM.    [provider]  carvedilol (COREG) 25 MG tablet Take 25 mg by mouth 2 (two) times daily with a meal.     [provider]  clopidogrel (PLAVIX) 75 MG tablet Take 1 tablet (75 mg total) by mouth daily. 03/22/20   Barton Dubois, MD  cyclobenzaprine (FLEXERIL) 10 MG tablet Take 10 mg by mouth 3 (three) times daily as needed for muscle spasms.    [provider]  dextromethorphan-guaiFENesin (MUCINEX DM) 30-600 MG 12hr tablet Take 1 tablet by mouth 2 (two) times daily. 03/22/20   Barton Dubois, MD  furosemide (LASIX) 20 MG tablet Take 40 mg by mouth in the morning.    [provider]  gabapentin (NEURONTIN) 600 MG tablet Take 300 mg by mouth 3 (three) times daily.    [provider]  hydrALAZINE (APRESOLINE) 100 MG tablet Take 100 mg by mouth 3 (three) times daily.     [provider]  hydrOXYzine (ATARAX/VISTARIL) 50 MG tablet Take 50 mg by mouth in the morning and at bedtime.     [provider]  insulin lispro (HUMALOG) 100 UNIT/ML injection Inject 5-16 Units into the skin 3 (three) times daily before meals. Patient injects according to sliding scale at home.  [provider]  ipratropium-albuterol (DUONEB) 0.5-2.5 (3) MG/3ML SOLN Inhale 3 mLs into the lungs 3 (three) times daily as needed. 12/27/19   [provider]  isosorbide mononitrate (IMDUR) 30 MG 24 hr tablet Take 30 mg by mouth 3 (three) times daily.    [provider]  LANTUS SOLOSTAR 100 UNIT/ML Solostar Pen Inject 50 Units into the skin at bedtime.  10/19/19   [provider]  mirtazapine (REMERON) 15 MG tablet Take 15 mg by mouth at bedtime as needed. 03/07/20   [provider]  ondansetron (ZOFRAN-ODT) 4 MG disintegrating tablet Take 1 tablet by mouth  daily as needed. 01/16/20   [provider]  OneTouch Delica Lancets 93O MISC Apply 1 each topically in the morning and at bedtime. 03/07/20   [provider]  [facility] VERIO test strip 1 each by Other route in the morning and at bedtime. 03/07/20   [provider]  pantoprazole (PROTONIX) 40 MG tablet Take 40 mg by mouth in the morning.    [provider]  sodium chloride 0.9 % nebulizer solution Take 1 mL by nebulization in the morning and at bedtime. 02/03/20   [provider]  venlafaxine XR (EFFEXOR-XR) 150 MG 24 hr capsule Take 150 mg by mouth every evening.     [provider]      Allergies    Metformin and related, Nsaids, Pravastatin, Reglan [metoclopramide], Tramadol, and Vancomycin    Review of Systems   Review of Systems  Physical Exam Updated Vital Signs BP (!) 187/62   Pulse (!) 59   Temp 98.4 F (36.9 C) (Oral)   Resp (!) 9   Ht 1.575 m (5\' 2" )   Wt 77.6 kg   SpO2 100%   BMI 31.29 kg/m  Physical Exam  ED Results / Procedures / Treatments   Labs (all labs ordered are listed, but only abnormal results are displayed) Labs Reviewed  BASIC METABOLIC PANEL - Abnormal; Notable for the following components:      Result Value   Glucose, Bld 166 (*)    BUN 50 (*)    Creatinine, Ser 5.64 (*)    Calcium 7.7 (*)    GFR, Estimated 8 (*)    All other components within normal limits  CBC - Abnormal; Notable for the following components:   RBC 2.73 (*)    Hemoglobin 7.4 (*)    HCT 23.1 (*)    Platelets 146 (*)    All other components within normal limits  PROTIME-INR - Abnormal; Notable for the following components:   Prothrombin Time 16.8 (*)    INR 1.4 (*)    All other components within normal limits  CBC - Abnormal; Notable for the following components:   RBC 2.78 (*)    Hemoglobin 7.4 (*)    HCT 24.3 (*)    Platelets 129 (*)    nRBC 0.3 (*)    All other components within normal limits  TROPONIN I (HIGH  SENSITIVITY)    EKG EKG Interpretation  Date/Time:  Saturday Dec 21 2021 01:29:48 EDT Ventricular Rate:  72 PR Interval:  161 QRS Duration: 82 QT Interval:  440 QTC Calculation: 482 R Axis:   12 Text Interpretation: Sinus rhythm Confirmed by Ripley Fraise [phone]) on 12/21/2021 1:31:27 AM  Radiology DG Chest Portable 1 View  Result Date: 12/21/2021 CLINICAL DATA:  Chest pain. EXAM: PORTABLE CHEST 1 VIEW COMPARISON:  Most recent available radiograph 03/21/2020 FINDINGS: Tracheostomy tube tip projects over  the thoracic inlet. Lung volumes are low. Mild cardiomegaly which is similar to prior exam. Diffuse hazy opacity in the right hemithorax which may represent layering effusion. There is trace fluid in the right minor fissure. No confluent consolidation. No pneumothorax. No acute osseous abnormalities are seen. IMPRESSION: 1. Low lung volumes. Diffuse hazy opacity in the right hemithorax may represent layering effusion. Trace fluid in the right minor fissure. 2. Stable mild cardiomegaly. Electronically Signed   By: Keith Rake M.D.   On: 12/21/2021 01:57    Procedures Procedures    Medications Ordered in ED Medications  hydrALAZINE (APRESOLINE) injection 20 mg (has no administration in time range)  HYDROcodone-acetaminophen (NORCO/VICODIN) 5-325 MG per tablet 2 tablet (2 tablets Oral Given 12/21/21 0134)  lidocaine-EPINEPHrine (XYLOCAINE W/EPI) 2 %-1:200000 (PF) injection (  Given 12/21/21 0346)  LORazepam (ATIVAN) tablet 1 mg (1 mg Oral Given 12/21/21 0328)  tranexamic acid (CYKLOKAPRON) 1000 MG/10ML topical solution 500 mg (500 mg Topical Given by Other 12/21/21 0736)  cloNIDine (CATAPRES) tablet 0.2 mg (0.2 mg Oral Given 12/21/21 0823)    ED Course/ Medical Decision Making/ A&P Clinical Course as of 12/21/21 1022  Sat Dec 21, 2021  0121 Hemoglobin(!): 7.4 Chronic anemia noted [DW]  0223 Creatinine(!): 5.64 Chronic renal failure [DW]  0255 Patient improved and resting  comfortably.  She reports that prior to her last transfusion, her hemoglobin was in the 6 range.  She received 1 unit. [DW]  9381 At this time, I do not feel she needs emergent transfusion.  She feels improved.  Suspect some of her chest pain is due to the underlying pleural effusion.  Low suspicion for ACS/PE at this time.  She is compliant with her anticoagulation [DW]  0323 Patient improved.  She is most concerned about her epistaxis.  She had a minimal amount of bleeding here.  The bleeding has now stopped. [DW]  [phone] Family is concerned that she may need a blood transfusion, therefore we will monitor patient in the ER for several hours, recheck CBC later this morning and reassess.  Patient does not want to be admitted but is willing to have a brief observation in the emergency department [DW]  [phone] Patient has been stable throughout the stay in the emergency department.  No active bleeding.  Repeat CBC reveals stable hemoglobin. [DW]  [phone] Patient noted that her chest pain was an ongoing and chronic issue.  Initial chest pain work-up in the ED was unremarkable.  At this point patient is safe for discharge home. [DW]    Clinical Course User Index [DW] Ripley Fraise, MD                           Medical Decision Making Patient with nosebleed.  It was cauterized and had TXA placed by Dr. Christy Gentles.  Patient has become extremely hypertensive here in the ED.  She is asymptomatic and is not bleeding with her blood pressure elevated.  She appears stable for discharge despite high blood pressure she has not had her morning medications.  She is being given her clonidine here in the ED.  However, the time it would take to get the other medications up from pharmacy would be longer than the patient's ability to go home and take her home medications. She is also anemic and this has been from bleeding but has been transfused in the past couple of days and here she had stable hemoglobin of 7.4 on initial and  repeat. She is aware that she needs to not touch her nose and not do any maneuvers that would increase rebleeding likelihood. She is aware of return precautions and need for follow-up and voices understanding. Patient had return of her bleeding.  It is just continuing to ooze. Care discussed with Dr. Carles Collet.  He request that patient be transferred to [facility] as there is no ENT appear Care discussed with Dr. Janace Hoard.  He will see her after admission to [facility].  He advises against doing any packing at this time. Dr. Janace Hoard request that the hospitalist at [facility] notify him upon her arrival and he will see her in consultation.  Amount and/or Complexity of Data Reviewed Labs: ordered. Decision-making details documented in ED Course. Radiology: ordered. ECG/medicine tests: ordered. Discussion of management or test interpretation with external provider(s): Drs. Tat and Byers  Risk Prescription drug management. Decision regarding hospitalization.   10:22 AM No bleeding present now Patient with significant hypertension but has not has usual am bp meds. Clonidine and hydralazine ordered Discussed care including no sneezing or valsalva maneuvers    Patient with bleeding of prior right nares. Patient is on Eliquis-unclear from recent records why patient is on chronic anticoagulation but Dr. Christy Gentles notes VTE.   She will need to have held with ongoing bleeding and anemia Patient with anemia- she has had multiple evaluations this week and required transfusion.  Review of recent hgb in our system range 6-9 over past 2 years.      Final Clinical Impression(s) / ED Diagnoses Final diagnoses:  Epistaxis  Pleurisy  Chronic anemia    Rx / DC Orders ED Discharge Orders     None         Pattricia Boss, MD 12/21/21 4765    Pattricia Boss, MD 12/21/21 4650    Pattricia Boss, MD 12/21/21 1022

## 2021-12-21 NOTE — Assessment & Plan Note (Signed)
Continue home dose of nifedipine, hydralazine, clonidine, and carvedilol -- There have been concerns regarding the patient's compliance.  The medical record

## 2021-12-21 NOTE — Assessment & Plan Note (Signed)
Appears there is a major musculoskeletal component -Very reproducible on examination palpation -Very low clinical suspicion for PE -Cycle troponins

## 2021-12-21 NOTE — Assessment & Plan Note (Signed)
Patient endorses worsening nausea -Question uremic symptoms versus nausea from epistaxis Discussed case with nephrology, Dr. Carolin Sicks --They will consult after patient transferred to [facility] Patient had fistula placed left arm 10/11/2021 Currently does not need urgent dialysis

## 2021-12-21 NOTE — Assessment & Plan Note (Signed)
Repeat hemoglobin A1c NovoLog sliding scale Holding off on long-acting insulin temporarily and follow CBGs

## 2021-12-21 NOTE — Assessment & Plan Note (Signed)
Baseline hemoglobin 8-9 Presented with hemoglobin 7.4 Holding apixaban temporarily

## 2021-12-21 NOTE — Assessment & Plan Note (Signed)
Appears clinically euvolemic Continue home dose of furosemide and metolazone

## 2021-12-21 NOTE — ED Notes (Addendum)
Bleeding remains controlled.  Pt reeducated to not nose blow nose or agitate nare.

## 2021-12-21 NOTE — ED Notes (Signed)
Packing removed from R nare.  Bleeding currently controlled.

## 2021-12-21 NOTE — ED Notes (Signed)
Gave pt dinner tray. Informed nurse.

## 2021-12-21 NOTE — ED Notes (Addendum)
Pt called out and her nose is lightly bleeding again.  Will notify EDP.

## 2021-12-21 NOTE — ED Notes (Signed)
Unsuccessful IV attempt x 1, but blood specimens collected

## 2021-12-21 NOTE — ED Triage Notes (Signed)
Pt presents to ED from home with family, c/o chest pain, headache, and nosebleed. Pt reports being seen at ER in [place] on Tues night, Wed night, Thurs night, and Friday night- pt seen there for different complaints, including weak, back pain, lightheaded, chest pain, nosebleed, and anxiety. Pt says they were mean to her last night and she is still having chest pain with nose trickling blood at this time. Pt has fistula to left arm, but is not actively receiving dialysis at this time.

## 2021-12-22 ENCOUNTER — Inpatient Hospital Stay (HOSPITAL_COMMUNITY): Payer: 59

## 2021-12-22 DIAGNOSIS — Z7901 Long term (current) use of anticoagulants: Secondary | ICD-10-CM | POA: Diagnosis not present

## 2021-12-22 DIAGNOSIS — R079 Chest pain, unspecified: Secondary | ICD-10-CM | POA: Diagnosis not present

## 2021-12-22 DIAGNOSIS — D62 Acute posthemorrhagic anemia: Secondary | ICD-10-CM | POA: Diagnosis not present

## 2021-12-22 DIAGNOSIS — R040 Epistaxis: Secondary | ICD-10-CM | POA: Diagnosis not present

## 2021-12-22 LAB — CBC
HCT: 25.6 % — ABNORMAL LOW (ref 36.0–46.0)
HCT: 24.6 % — ABNORMAL LOW (ref 36.0–46.0)
Hemoglobin: 8.1 g/dL — ABNORMAL LOW (ref 12.0–15.0)
Hemoglobin: 8 g/dL — ABNORMAL LOW (ref 12.0–15.0)
MCH: 27.1 pg (ref 26.0–34.0)
MCH: 27.6 pg (ref 26.0–34.0)
MCHC: 31.6 g/dL (ref 30.0–36.0)
MCHC: 32.5 g/dL (ref 30.0–36.0)
MCV: 85.6 fL (ref 80.0–100.0)
MCV: 84.8 fL (ref 80.0–100.0)
Platelets: 149 10*3/uL — ABNORMAL LOW (ref 150–400)
Platelets: 136 10*3/uL — ABNORMAL LOW (ref 150–400)
RBC: 2.99 MIL/uL — ABNORMAL LOW (ref 3.87–5.11)
RBC: 2.9 MIL/uL — ABNORMAL LOW (ref 3.87–5.11)
RDW: 14.2 % (ref 11.5–15.5)
RDW: 13.9 % (ref 11.5–15.5)
WBC: 8 10*3/uL (ref 4.0–10.5)
WBC: 8 10*3/uL (ref 4.0–10.5)
nRBC: 0 % (ref 0.0–0.2)
nRBC: 0 % (ref 0.0–0.2)

## 2021-12-22 LAB — RENAL FUNCTION PANEL
Albumin: 3 g/dL — ABNORMAL LOW (ref 3.5–5.0)
Anion gap: 10 (ref 5–15)
BUN: 48 mg/dL — ABNORMAL HIGH (ref 6–20)
CO2: 28 mmol/L (ref 22–32)
Calcium: 8.6 mg/dL — ABNORMAL LOW (ref 8.9–10.3)
Chloride: 100 mmol/L (ref 98–111)
Creatinine, Ser: 5.67 mg/dL — ABNORMAL HIGH (ref 0.44–1.00)
GFR, Estimated: 8 mL/min — ABNORMAL LOW (ref >60)
Glucose, Bld: 192 mg/dL — ABNORMAL HIGH (ref 70–99)
Phosphorus: 5.7 mg/dL — ABNORMAL HIGH (ref 2.5–4.6)
Potassium: 4.5 mmol/L (ref 3.5–5.1)
Sodium: 138 mmol/L (ref 135–145)

## 2021-12-22 LAB — BASIC METABOLIC PANEL
Anion gap: 10 (ref 5–15)
BUN: 49 mg/dL — ABNORMAL HIGH (ref 6–20)
CO2: 29 mmol/L (ref 22–32)
Calcium: 8.7 mg/dL — ABNORMAL LOW (ref 8.9–10.3)
Chloride: 100 mmol/L (ref 98–111)
Creatinine, Ser: 5.87 mg/dL — ABNORMAL HIGH (ref 0.44–1.00)
GFR, Estimated: 8 mL/min — ABNORMAL LOW (ref >60)
Glucose, Bld: 159 mg/dL — ABNORMAL HIGH (ref 70–99)
Potassium: 4.4 mmol/L (ref 3.5–5.1)
Sodium: 139 mmol/L (ref 135–145)

## 2021-12-22 LAB — ECHOCARDIOGRAM COMPLETE
AR max vel: 2.5 cm2
AV Peak grad: 11.2 mmHg
Ao pk vel: 1.67 m/s
Area-P 1/2: 3.53 cm2
Height: 62 in
S' Lateral: 2.6 cm
Weight: 2737.23 oz

## 2021-12-22 LAB — BRAIN NATRIURETIC PEPTIDE: B Natriuretic Peptide: 136.2 pg/mL — ABNORMAL HIGH (ref 0.0–100.0)

## 2021-12-22 LAB — CREATININE, URINE, RANDOM: Creatinine, Urine: 81.05 mg/dL

## 2021-12-22 LAB — HEMOGLOBIN A1C
Hgb A1c MFr Bld: 7.8 % — ABNORMAL HIGH (ref 4.8–5.6)
Hgb A1c MFr Bld: 7.7 % — ABNORMAL HIGH (ref 4.8–5.6)
Mean Plasma Glucose: 177.16 mg/dL
Mean Plasma Glucose: 174.29 mg/dL

## 2021-12-22 LAB — SODIUM, URINE, RANDOM: Sodium, Ur: 98 mmol/L

## 2021-12-22 LAB — GLUCOSE, CAPILLARY
Glucose-Capillary: 188 mg/dL — ABNORMAL HIGH (ref 70–99)
Glucose-Capillary: 248 mg/dL — ABNORMAL HIGH (ref 70–99)
Glucose-Capillary: 184 mg/dL — ABNORMAL HIGH (ref 70–99)
Glucose-Capillary: 208 mg/dL — ABNORMAL HIGH (ref 70–99)

## 2021-12-22 LAB — OSMOLALITY, URINE: Osmolality, Ur: 369 mOsm/kg (ref 300–900)

## 2021-12-22 LAB — TYPE AND SCREEN
ABO/RH(D): O POS
Antibody Screen: NEGATIVE

## 2021-12-22 LAB — URIC ACID: Uric Acid, Serum: 6.4 mg/dL (ref 2.5–7.1)

## 2021-12-22 LAB — OSMOLALITY: Osmolality: 313 mOsm/kg — ABNORMAL HIGH (ref 275–295)

## 2021-12-22 MED ORDER — INSULIN ASPART 100 UNIT/ML IJ SOLN
0.0000 [IU] | Freq: Every day | INTRAMUSCULAR | Status: DC
  Administered 2021-12-22: 2 [IU] via SUBCUTANEOUS

## 2021-12-22 MED ORDER — VITAMIN B-12 1000 MCG PO TABS
1000.0000 ug | ORAL_TABLET | Freq: Every day | ORAL | Status: DC
  Administered 2021-12-22 – 2021-12-23 (×2): 1000 ug via ORAL
  Filled 2021-12-22 (×2): qty 1

## 2021-12-22 MED ORDER — SALINE SPRAY 0.65 % NA SOLN
1.0000 | NASAL | Status: DC | PRN
  Administered 2021-12-22: 1 via NASAL
  Filled 2021-12-22: qty 44

## 2021-12-22 MED ORDER — LORAZEPAM 0.5 MG PO TABS
0.5000 mg | ORAL_TABLET | Freq: Two times a day (BID) | ORAL | Status: DC | PRN
  Administered 2021-12-22: 0.5 mg via ORAL
  Filled 2021-12-22: qty 1

## 2021-12-22 MED ORDER — INSULIN ASPART 100 UNIT/ML IJ SOLN
0.0000 [IU] | Freq: Three times a day (TID) | INTRAMUSCULAR | Status: DC
  Administered 2021-12-22 – 2021-12-23 (×2): 2 [IU] via SUBCUTANEOUS

## 2021-12-22 NOTE — Consult Note (Addendum)
Renal Service Consult Note [facility]  Joy Yoder 12/22/2021 Sol Blazing, MD Requesting Physician: Dr. Ronnie Derby  Reason for Consult: Renal failure HPI: The patient is a 56 y.o. year-old w/ hx of CHF, CKD IV, COPD, DDD, DM2 on insulin, HTN, PNA, hx CVA, neuropathy who presented to ED on 5/20 last night w/ chest pain , HA and nosebleed. Pt is from [place].  At [facility] ED BP 227/76, afeb, RR/ HR wnl. Hb 7.4, wbc 7K. Pt has trach from prolonged intubation in 2020. Hx DVT on eliquis. Pt was cauterized and tx'd to [facility]. At [facility] ENT recommended holding eliquis a few days if possible and removing packing. Bleeding controlled for now. Creat was 5.6 on labs. We are asked to see for renal failure.   Pt is f/b nephrology in [place]. She is not on HD. She denies any chronic significant nausea, loss of appetite, severe fatigue or confusion / somnolence. She is active. Has L AVF placed in March.     ROS - denies CP, no joint pain, no HA, no blurry vision, no rash, no diarrhea, no nausea/ vomiting, no dysuria, no difficulty voiding   Past Medical History  Past Medical History:  Diagnosis Date   Acute on chronic respiratory failure with hypoxia (HCC)    Anxiety    CHF (congestive heart failure) (HCC)    Chronic diastolic heart failure (HCC)    Chronic kidney disease, stage III (moderate) (HCC)    COPD, severe (HCC)    DDD (degenerative disc disease)    DDD (degenerative disc disease), thoracic    chest to back, legs,    Diabetes mellitus    DJD (degenerative joint disease)    Hypertension    Kidney disease    stage 5   Lobar pneumonia ([place])    Neuropathy    Stroke [facility])    Past Surgical History  Past Surgical History:  Procedure Laterality Date   ABDOMINAL HYSTERECTOMY     bladder tack     CHOLECYSTECTOMY     EYE SURGERY     THYROID SURGERY     Family History No family history on file. Social History  reports that she has never smoked. She has  never used smokeless tobacco. She reports that she does not drink alcohol and does not use drugs. Allergies  Allergies  Allergen Reactions   Morphine Shortness Of Breath   Metformin And Related Diarrhea   Nsaids     Kidney disease   Pravastatin Swelling   Reglan [Metoclopramide]     shakes   Tramadol Itching   Vancomycin Other (See Comments)    Kidney failure Kidney failure    Home medications Prior to Admission medications   Medication Sig Start Date End Date Taking? Authorizing Provider  albuterol (PROVENTIL) (2.5 MG/3ML) 0.083% nebulizer solution Take 2.5 mg by nebulization 3 (three) times daily as needed. 12/27/19  Yes [provider]  albuterol (VENTOLIN HFA) 108 (90 Base) MCG/ACT inhaler Inhale 2 puffs into the lungs every 6 (six) hours. 12/27/19  Yes [provider]  apixaban (ELIQUIS) 5 MG TABS tablet Take 5 mg by mouth 2 (two) times daily.   Yes [provider]  atorvastatin (LIPITOR) 80 MG tablet Take 80 mg by mouth daily at 6 PM.   Yes [provider]  Biotin 10 MG TABS Take by mouth.   Yes [provider]  calcitRIOL (ROCALTROL) 0.25 MCG capsule Take 0.25 mcg by mouth daily.   Yes  [provider]  carvedilol (COREG) 25 MG tablet Take 25 mg by mouth 2 (two) times daily with a meal.    Yes [provider]  cephALEXin (KEFLEX) 500 MG capsule Take 500 mg by mouth 2 (two) times daily.   Yes [provider]  Cholecalciferol 125 MCG (5000 UT) capsule Take by mouth.   Yes [provider]  cloNIDine (CATAPRES) 0.1 MG tablet Take 0.1 mg by mouth 3 (three) times daily.   Yes [provider]  clopidogrel (PLAVIX) 75 MG tablet Take 1 tablet (75 mg total) by mouth daily. 03/22/20  Yes Barton Dubois, MD  cyanocobalamin 1000 MCG tablet Take by mouth.   Yes [provider]  cyclobenzaprine (FLEXERIL) 10 MG tablet Take 10 mg by mouth 3 (three) times daily as needed for muscle spasms.   Yes  [provider]  dextromethorphan-guaiFENesin (MUCINEX DM) 30-600 MG 12hr tablet Take 1 tablet by mouth 2 (two) times daily. 03/22/20  Yes Barton Dubois, MD  diclofenac Sodium (VOLTAREN) 1 % GEL Apply topically.   Yes [provider]  dicyclomine (BENTYL) 10 MG capsule Take 10 mg by mouth daily as needed for spasms.   Yes [provider]  furosemide (LASIX) 20 MG tablet Take 40 mg by mouth in the morning.   Yes [provider]  gabapentin (NEURONTIN) 600 MG tablet Take 300 mg by mouth 3 (three) times daily.   Yes [provider]  hydrALAZINE (APRESOLINE) 100 MG tablet Take 100 mg by mouth 3 (three) times daily.    Yes [provider]  insulin lispro (HUMALOG) 100 UNIT/ML injection Inject 5-16 Units into the skin 3 (three) times daily before meals. Patient injects according to sliding scale at home.   Yes [provider]  ipratropium-albuterol (DUONEB) 0.5-2.5 (3) MG/3ML SOLN Inhale 3 mLs into the lungs 3 (three) times daily as needed. 12/27/19  Yes [provider]  isosorbide mononitrate (IMDUR) 30 MG 24 hr tablet Take 30 mg by mouth 3 (three) times daily.   Yes [provider]  LANTUS SOLOSTAR 100 UNIT/ML Solostar Pen Inject 50 Units into the skin at bedtime.  10/19/19  Yes [provider]  lidocaine (LIDODERM) 5 % 1 patch daily. 12/10/21  Yes [provider]  mirtazapine (REMERON) 15 MG tablet Take 15 mg by mouth at bedtime as needed. 03/07/20  Yes [provider]  NIFEdipine (ADALAT CC) 90 MG 24 hr tablet Take 90 mg by mouth daily.   Yes [provider]  ondansetron (ZOFRAN-ODT) 4 MG disintegrating tablet Take 1 tablet by mouth daily as needed. 01/16/20  Yes [provider]  pantoprazole (PROTONIX) 40 MG tablet Take 40 mg by mouth in the morning.   Yes [provider]  sevelamer (RENAGEL) 800 MG tablet Take 800 mg by mouth 3 (three) times daily with meals.   Yes [provider]  venlafaxine XR (EFFEXOR-XR) 150 MG 24 hr capsule Take 150 mg by mouth every evening.    Yes [provider]  zinc gluconate 50 MG tablet Take by mouth.   Yes [provider]  amitriptyline (ELAVIL) 25 MG tablet Take 50 mg by mouth at bedtime. Patient not taking: Reported on 12/21/2021    [provider]  amLODipine (NORVASC) 5 MG tablet Take 1.5 tablets (7.5 mg total) by mouth daily. Patient not taking: Reported on 12/21/2021 03/22/20   Barton Dubois, MD  LORazepam (ATIVAN) 1 MG tablet Take 1 mg by mouth 2 (two) times  daily. 12/20/21   [provider]  metolazone (ZAROXOLYN) 5 MG tablet Take by mouth.    [provider]  OneTouch Delica Lancets 88B MISC Apply 1 each topically in the morning and at bedtime. 03/07/20   [provider]  [facility] VERIO test strip 1 each by Other route in the morning and at bedtime. 03/07/20   [provider]     Vitals:   12/22/21 1612 12/22/21 1631 12/22/21 1929 12/22/21 2042  BP: (!) 158/60  (!) 146/63   Pulse: 72 70 69 74  Resp: 19 18 19 18   Temp:   98.6 F (37 C)   TempSrc:   Oral   SpO2: 95% 94% 95% 93%  Weight:      Height:       Exam Gen alert, no distress No rash, cyanosis or gangrene Sclera anicteric, throat clear  No jvd or bruits Chest clear bilat to bases, no rales/ wheezing RRR no MRG Abd soft ntnd no mass or ascites +bs GU defer MS no joint effusions or deformity Ext no LE or UE edema, no wounds or ulcers Neuro is alert, Ox 3 , nf    LUA AVF+bruit       Home meds include - albuterol, eliquis, lipitor, biotin, rocaltrol, coreg 25 bid, cholecalciferol, clonidine 0.1 tid, plavix, cyanocobalamin, flexeril, mucinex, voltaren, bentyl, lasix 40 qam, neurontin 300 tid, hydralazine 100 tid, insulin lispro/ lantus, duoneb, imdur 30 tid, mirtazapine, nifedipine cc 90, zofran, ondansetron, pantoprazole, sevelamer 800 tid, venlafaxine xr, zinc, amlodipine 10, lorazepam,  metolazone 5, prednisone taper     Per CE > last creat was on 10/11/21 creat = 5.0, eGFR 8 ml/min       Date   Creat  eGFR    2012   0.7- 0.9    2018   1.38- 2.40 25- 50, stage 3b    2021   1.38- 2.18 29- 40, stage 3b     Jan- June 2022 3.23  15 ml/min in CE, stage 4    10/11/21  5.0  8 ml/min in CE, stage 5    5/20   5.64     12/22/21  5.87    UA 5/20 - 100 prot, >50 rbc, 0-5 wbc/ epi   UNa 98, UCr 81   Assessment/ Plan: 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 Pt is followed by nephrology in [place], [place]. Her renal function has declined significantly in the last 2 years. She appears to stage 5 CKD now. UA and lytes are unremarkable. No need for renal US. Has L arm AVF not ready to use. No uremic signs/ symptoms, no indication for dialysis at this time. She knows to f/u with her renal MD in [place]. No new suggestions. Will sign off.  Epistaxis - bleeding controlled at this time. Per pmd. DM2 - on insulin Volume - appears euvolemic. Taking lasix 40 daily.  Anemia - Hb 7.4- 8, transfuse prn is Hb < 7.  Chronic resp failure - sp trach in 2020.  HTN - BP's okay, cont home meds Hx DVT - taking eliquis, on hold for now.       Kelly Splinter  MD 12/22/2021, 10:33 PM

## 2021-12-22 NOTE — Evaluation (Signed)
Physical Therapy Evaluation Patient Details Name: Joy Yoder MRN: [id number] DOB: 1966-04-20 Today's Date: 12/22/2021  History of Present Illness  Pt is a 56 y/o female admited secondary to anemia from epistaxis. PMH includes HTN, DM, CKD, DVT, CHF, and trach.  Clinical Impression  Pt admitted secondary to problem above with deficits below. Pt requiring min guard A for mobility tasks this session. Pt reports some fatigue and weakness throughout. Reports family can assist as needed. Recommending HHPT at d/c to address current deficits. Will continue to follow acutely.        Recommendations for follow up therapy are one component of a multi-disciplinary discharge planning process, led by the attending physician.  Recommendations may be updated based on patient status, additional functional criteria and insurance authorization.  Follow Up Recommendations Home health PT    Assistance Recommended at Discharge Intermittent Supervision/Assistance  Patient can return home with the following  Assistance with cooking/housework    Equipment Recommendations None recommended by PT  Recommendations for Other Services       Functional Status Assessment Patient has had a recent decline in their functional status and demonstrates the ability to make significant improvements in function in a reasonable and predictable amount of time.     Precautions / Restrictions Precautions Precautions: Fall;Other (comment) Precaution Comments: trach at baseline Restrictions Weight Bearing Restrictions: No      Mobility  Bed Mobility Overal bed mobility: Needs Assistance Bed Mobility: Supine to Sit     Supine to sit: Supervision     General bed mobility comments: supervision for safety and line management    Transfers Overall transfer level: Needs assistance Equipment used: Rolling walker (2 wheels) Transfers: Sit to/from Stand Sit to Stand: Min guard           General transfer  comment: Min guard for safety    Ambulation/Gait Ambulation/Gait assistance: Min guard Gait Distance (Feet): 100 Feet Assistive device: Rolling walker (2 wheels) Gait Pattern/deviations: Step-through pattern, Decreased stride length Gait velocity: Decreased     General Gait Details: Slow, cautious gait. Functional weakness noted and mildly unsteady, but no overt LOB. Min guard for safety.  Stairs            Wheelchair Mobility    Modified Rankin (Stroke Patients Only)       Balance Overall balance assessment: Needs assistance Sitting-balance support: No upper extremity supported, Feet supported Sitting balance-Leahy Scale: Good     Standing balance support: Bilateral upper extremity supported Standing balance-Leahy Scale: Poor Standing balance comment: Reliant on BUE support                             Pertinent Vitals/Pain Pain Assessment Pain Assessment: No/denies pain    Home Living Family/patient expects to be discharged to:: Private residence Living Arrangements: Alone Available Help at Discharge: Family Type of Home: Apartment Home Access: Level entry       Home Layout: One level Home Equipment: Rollator (4 wheels);Tub bench      Prior Function Prior Level of Function : Independent/Modified Independent             Mobility Comments: uses rollator at baseline       Hand Dominance        Extremity/Trunk Assessment   Upper Extremity Assessment Upper Extremity Assessment: Generalized weakness    Lower Extremity Assessment Lower Extremity Assessment: Generalized weakness    Cervical / Trunk Assessment Cervical / Trunk Assessment:  Normal  Communication   Communication: No difficulties  Cognition Arousal/Alertness: Awake/alert Behavior During Therapy: WFL for tasks assessed/performed Overall Cognitive Status: Within Functional Limits for tasks assessed                                           General Comments General comments (skin integrity, edema, etc.): Oxygen sats >90% on 4L trach collar    Exercises     Assessment/Plan    PT Assessment Patient needs continued PT services  PT Problem List Decreased strength;Decreased activity tolerance;Decreased balance;Decreased mobility       PT Treatment Interventions DME instruction;Gait training;Functional mobility training;Therapeutic activities;Balance training;Therapeutic exercise;Patient/family education    PT Goals (Current goals can be found in the Care Plan section)  Acute Rehab PT Goals Patient Stated Goal: to go home PT Goal Formulation: With patient Time For Goal Achievement: 01/05/22 Potential to Achieve Goals: Good    Frequency Min 3X/week     Co-evaluation               AM-PAC PT "6 Clicks" Mobility  Outcome Measure Help needed turning from your back to your side while in a flat bed without using bedrails?: None Help needed moving from lying on your back to sitting on the side of a flat bed without using bedrails?: A Little Help needed moving to and from a bed to a chair (including a wheelchair)?: A Little Help needed standing up from a chair using your arms (e.g., wheelchair or bedside chair)?: A Little Help needed to walk in hospital room?: A Little Help needed climbing 3-5 steps with a railing? : A Little 6 Click Score: 19    End of Session Equipment Utilized During Treatment: Gait belt;Oxygen Activity Tolerance: Patient tolerated treatment well Patient left: in chair;with call bell/phone within reach;with chair alarm set Nurse Communication: Mobility status PT Visit Diagnosis: Unsteadiness on feet (R26.81);Muscle weakness (generalized) (M62.81)    Time: 6283-6629 PT Time Calculation (min) (ACUTE ONLY): 29 min   Charges:   PT Evaluation $PT Eval Moderate Complexity: 1 Mod PT Treatments $Gait Training: 8-22 mins        Lou Miner, DPT  Acute Rehabilitation Services  Pager:  [phone] Office: [phone]   Rudean Hitt 12/22/2021, 2:05 PM

## 2021-12-22 NOTE — Progress Notes (Signed)
PROGRESS NOTE                                                                                                                                                                                                             Patient Demographics:    Joy Yoder, is a 56 y.o. female, DOB - 14-Feb-1966, FMB:[id number]  Outpatient Primary MD for the patient is System, Provider Not In    LOS - 1  Admit date - 12/21/2021    Chief Complaint  Patient presents with   Chest Pain    nosebleed       Brief Narrative (HPI from H&P)   56 year old female with a history of CKD 5, chronic respiratory failure on 4 L with tracheostomy, hypertension, chronic chest pain, depression, lower extremity DVT, OSA, and CHF presented to [facility], ER with recurrent epistaxis and chest pain, she also had some acute blood loss related anemia.  She was transferred to [facility] for ENT evaluation due to her ongoing nosebleed.   Subjective:    Joy Yoder today has, No headache, No chest pain, No abdominal pain - No Nausea, No new weakness tingling or numbness, no shortness of breath, no further nosebleed.   Assessment  & Plan :    Acute on chronic blood loss anemia -  Baseline hemoglobin 8-9 due to anemia of chronic disease in the setting of CKD 5, mild drop in H&H, nosebleeds have resolved continue to monitor H&H for another 24 hrs, continue to hold anticoagulation.  History of deep venous thrombosis (DVT) of distal vein of left lower extremity x 2 -  Holding apixaban temporarily in the setting of acute blood loss anemia and epistaxis, likely will resume in the next 2 to 3 days as discussed with ENT.  Chronic respiratory failure with hypoxia (HCC) -  Chronically on 4 L nasal cannula at home, Currently saturating well with blow-by trach collar, FiO2 28%, nasal saline spray also added.  CKD (chronic kidney disease) stage 5, GFR less than 15 ml/min  (HCC) - currently no uremic symptoms this morning, nephrology to follow.  She is getting close to ESRD and follows with nephrology in [place].  Continue to monitor closely  Epistaxis - in the setting of being on Eliquis chronically and on 4 L nasal cannula oxygen likely causing some nasal drying, nosebleeds seem  to have resolved, anticoagulation held.  Case discussed with ENT physician Dr. Janace Hoard.  Continue to monitor.  Patient also has her own ENT physician at [facility].  Nasal saline spray added.  Atypical chest pain - Appears there is a major musculoskeletal component, chest pain was reproducible in the ER, now resolved.  Troponin negative.  Echocardiogram ordered upon admission is pending.   Chronic diastolic heart failure (HCC) - Appears clinically euvolemic, diuretics per nephrology.   Malignant hypertension - Continue home dose of nifedipine, hydralazine, clonidine, and carvedilol,  There have been concerns regarding the patient's compliance.  Counseled.  Continue to monitor.  Uncontrolled type 2 diabetes mellitus with hyperglycemia, with long-term current use of insulin (HCC) He on sliding scale insulin.  Lab Results  Component Value Date   HGBA1C 7.7 (H) 12/22/2021   CBG (last 3)  Recent Labs    12/21/21 1222 12/22/21 0137  GLUCAP 111* 188*         Condition -  Guarded  Family Communication  :  None present  Code Status :  Full  Consults  :  Renal, ENT  PUD Prophylaxis :    Procedures  :      TTE -       Disposition Plan  :    Status is: Inpatient  DVT Prophylaxis  :    SCDs Start: 12/21/21 1448    Lab Results  Component Value Date   PLT 136 (L) 12/22/2021    Diet :  Diet Order             Diet renal with fluid restriction Fluid restriction: 1200 mL Fluid; Room service appropriate? Yes; Fluid consistency: Thin  Diet effective now                    Inpatient Medications  Scheduled Meds:  amitriptyline  50 mg Oral QHS   atorvastatin   80 mg Oral q1800   calcitRIOL  0.25 mcg Oral Daily   carvedilol  25 mg Oral BID WC   cloNIDine  0.1 mg Oral TID   hydrALAZINE  20 mg Intravenous Once   hydrALAZINE  100 mg Oral TID   insulin aspart  0-5 Units Subcutaneous QHS   insulin aspart  0-9 Units Subcutaneous TID WC   isosorbide mononitrate  30 mg Oral TID   mirtazapine  15 mg Oral QHS   NIFEdipine  90 mg Oral Daily   pantoprazole  40 mg Oral q AM   sevelamer carbonate  800 mg Oral TID WC   venlafaxine XR  150 mg Oral QPM   cyanocobalamin  1,000 mcg Oral Daily   Continuous Infusions: PRN Meds:.acetaminophen **OR** acetaminophen, ipratropium-albuterol, ondansetron **OR** ondansetron (ZOFRAN) IV, sodium chloride  Antibiotics  :    Anti-infectives (From admission, onward)    None        Time Spent in minutes  30   Lala Lund M.D on 12/22/2021 at 9:35 AM  To page go to [email or web address]   [facility] -  Office  [phone]  See all Orders from today for further details    Objective:   Vitals:   12/22/21 0110 12/22/21 0400 12/22/21 0825 12/22/21 0925  BP:  (!) 129/53  (!) 152/61  Pulse: 70 63 79 72  Resp: (!) 22 14 (!) 23 18  Temp:  98.3 F (36.8 C)    TempSrc:  Oral    SpO2: 95% 98% 99% 94%  Weight:      Height:  Wt Readings from Last 3 Encounters:  12/21/21 77.6 kg  08/09/20 77.6 kg  03/18/20 78.5 kg     Intake/Output Summary (Last 24 hours) at 12/22/2021 0935 Last data filed at 12/21/2021 2300 Gross per 24 hour  Intake 100 ml  Output 700 ml  Net -600 ml     Physical Exam  Awake Alert, No new F.N deficits, Trach site clean with TC [place].AT,PERRAL Supple Neck, No JVD,   Symmetrical Chest wall movement, Good air movement bilaterally, CTAB RRR,No Gallops,Rubs or new Murmurs,  +ve B.Sounds, Abd Soft, No tenderness,   No Cyanosis, Clubbing or edema      Data Review:    CBC Recent Labs  Lab 12/21/21 0111 12/21/21 0544 12/22/21 0626  WBC 8.5 7.1 8.0  HGB 7.4* 7.4* 8.0*   HCT 23.1* 24.3* 24.6*  PLT 146* 129* 136*  MCV 84.6 87.4 84.8  MCH 27.1 26.6 27.6  MCHC 32.0 30.5 32.5  RDW 14.1 14.0 13.9    Electrolytes Recent Labs  Lab 12/21/21 0111 12/22/21 0626 12/22/21 0851  NA 140 139  --   K 4.2 4.4  --   CL 98 100  --   CO2 31 29  --   GLUCOSE 166* 159*  --   BUN 50* 49*  --   CREATININE 5.64* 5.87*  --   CALCIUM 7.7* 8.7*  --   INR 1.4*  --   --   HGBA1C  --   --  7.7*  BNP  --  136.2*  --     ------------------------------------------------------------------------------------------------------------------ No results for input(s): CHOL, HDL, LDLCALC, TRIG, CHOLHDL, LDLDIRECT in the last 72 hours.  Lab Results  Component Value Date   HGBA1C 7.7 (H) 12/22/2021       Micro Results No results found for this or any previous visit (from the past 240 hour(s)).  Radiology Reports DG Chest Portable 1 View  Result Date: 12/21/2021 CLINICAL DATA:  Chest pain. EXAM: PORTABLE CHEST 1 VIEW COMPARISON:  Most recent available radiograph 03/21/2020 FINDINGS: Tracheostomy tube tip projects over the thoracic inlet. Lung volumes are low. Mild cardiomegaly which is similar to prior exam. Diffuse hazy opacity in the right hemithorax which may represent layering effusion. There is trace fluid in the right minor fissure. No confluent consolidation. No pneumothorax. No acute osseous abnormalities are seen. IMPRESSION: 1. Low lung volumes. Diffuse hazy opacity in the right hemithorax may represent layering effusion. Trace fluid in the right minor fissure. 2. Stable mild cardiomegaly. Electronically Signed   By: Keith Rake M.D.   On: 12/21/2021 01:57

## 2021-12-22 NOTE — Consult Note (Signed)
Reason for Consult:epistaxis Referring Physician: Dr Tat  Joy Yoder is an 56 y.o. female.  HPI: hx of epistaxis for about 1 to 2 weeks.  She is on Eliquis.  She has extensive medical problems.  She does have hypertension and her pressure has been up.  She was seen by emergency room and cauterized in an area on the right.  She wears nasal cannula as as oxygenation despite the fact that she has a trach.  Apparently the trach collar does not provide her with what she feels comfortable with.  Past Medical History:  Diagnosis Date   Acute on chronic respiratory failure with hypoxia (HCC)    Anxiety    CHF (congestive heart failure) (HCC)    Chronic diastolic heart failure (HCC)    Chronic kidney disease, stage III (moderate) (HCC)    COPD, severe (HCC)    DDD (degenerative disc disease)    DDD (degenerative disc disease), thoracic    chest to back, legs,    Diabetes mellitus    DJD (degenerative joint disease)    Hypertension    Kidney disease    stage 5   Lobar pneumonia ([place])    Neuropathy    Stroke [facility])     Past Surgical History:  Procedure Laterality Date   ABDOMINAL HYSTERECTOMY     bladder tack     CHOLECYSTECTOMY     EYE SURGERY     THYROID SURGERY      No family history on file.  Social History:  reports that she has never smoked. She has never used smokeless tobacco. She reports that she does not drink alcohol and does not use drugs.  Allergies:  Allergies  Allergen Reactions   Morphine Shortness Of Breath   Metformin And Related Diarrhea   Nsaids     Kidney disease   Pravastatin Swelling   Reglan [Metoclopramide]     shakes   Tramadol Itching   Vancomycin Other (See Comments)    Kidney failure Kidney failure     Medications: I have reviewed the patient's current medications.  Results for orders placed or performed during the hospital encounter of 12/21/21 (from the past 48 hour(s))  Basic metabolic panel     Status: Abnormal    Collection Time: 12/21/21  1:11 AM  Result Value Ref Range   Sodium 140 135 - 145 mmol/L   Potassium 4.2 3.5 - 5.1 mmol/L   Chloride 98 98 - 111 mmol/L   CO2 31 22 - 32 mmol/L   Glucose, Bld 166 (H) 70 - 99 mg/dL    Comment: Glucose reference range applies only to samples taken after fasting for at least 8 hours.   BUN 50 (H) 6 - 20 mg/dL   Creatinine, Ser 5.64 (H) 0.44 - 1.00 mg/dL   Calcium 7.7 (L) 8.9 - 10.3 mg/dL   GFR, Estimated 8 (L) >60 mL/min    Comment: (NOTE) Calculated using the CKD-EPI Creatinine Equation (2021)    Anion gap 11 5 - 15    Comment: Performed at [facility], [address] [place], [place] [address]  Troponin I (High Sensitivity)     Status: None   Collection Time: 12/21/21  1:11 AM  Result Value Ref Range   Troponin I (High Sensitivity) 8 <18 ng/L    Comment: (NOTE) Elevated high sensitivity troponin I (hsTnI) values and significant  changes across serial measurements may suggest ACS but many other  chronic and acute conditions are known to elevate hsTnI  results.  Refer to the "Links" section for chest pain algorithms and additional  guidance. Performed at [facility], [address], [place], [place] [address]   CBC     Status: Abnormal   Collection Time: 12/21/21  1:11 AM  Result Value Ref Range   WBC 8.5 4.0 - 10.5 K/uL   RBC 2.73 (L) 3.87 - 5.11 MIL/uL   Hemoglobin 7.4 (L) 12.0 - 15.0 g/dL   HCT 23.1 (L) 36.0 - 46.0 %   MCV 84.6 80.0 - 100.0 fL   MCH 27.1 26.0 - 34.0 pg   MCHC 32.0 30.0 - 36.0 g/dL   RDW 14.1 11.5 - 15.5 %   Platelets 146 (L) 150 - 400 K/uL   nRBC 0.0 0.0 - 0.2 %    Comment: Performed at [facility], [address] [place], [place] [address]  Protime-INR     Status: Abnormal   Collection Time: 12/21/21  1:11 AM  Result Value Ref Range   Prothrombin Time 16.8 (H) 11.4 - 15.2 seconds   INR 1.4 (H) 0.8 - 1.2    Comment: (NOTE) INR goal varies based on device and disease states. Performed at [facility], [address], [place], [place] [address]   CBC     Status: Abnormal   Collection Time: 12/21/21  5:44 AM  Result Value Ref Range   WBC 7.1 4.0 - 10.5 K/uL   RBC 2.78 (L) 3.87 - 5.11 MIL/uL   Hemoglobin 7.4 (L) 12.0 - 15.0 g/dL   HCT 24.3 (L) 36.0 - 46.0 %   MCV 87.4 80.0 - 100.0 fL   MCH 26.6 26.0 - 34.0 pg   MCHC 30.5 30.0 - 36.0 g/dL   RDW 14.0 11.5 - 15.5 %   Platelets 129 (L) 150 - 400 K/uL   nRBC 0.3 (H) 0.0 - 0.2 %    Comment: Performed at [facility], [address], [place], [place] [address]  CBG monitoring, ED     Status: Abnormal   Collection Time: 12/21/21 12:22 PM  Result Value Ref Range   Glucose-Capillary 111 (H) 70 - 99 mg/dL    Comment: Glucose reference range applies only to samples taken after fasting for at least 8 hours.  Urinalysis, Complete w Microscopic Urine, Clean Catch     Status: Abnormal   Collection Time: 12/21/21  3:48 PM  Result Value Ref Range   Color, Urine STRAW (A) YELLOW   APPearance CLEAR CLEAR   Specific Gravity, Urine 1.009 1.005 - 1.030   pH 9.0 (H) 5.0 - 8.0   Glucose, UA 150 (A) NEGATIVE mg/dL   Hgb urine dipstick SMALL (A) NEGATIVE   Bilirubin Urine NEGATIVE NEGATIVE   Ketones, ur NEGATIVE NEGATIVE mg/dL   Protein, ur 100 (A) NEGATIVE mg/dL   Nitrite NEGATIVE NEGATIVE   Leukocytes,Ua NEGATIVE NEGATIVE   RBC / HPF >50 (H) 0 - 5 RBC/hpf   WBC, UA 0-5 0 - 5 WBC/hpf   Bacteria, UA NONE SEEN NONE SEEN   Squamous Epithelial / LPF 0-5 0 - 5    Comment: Performed at [facility], [address], [place], [place] [address]  HIV Antibody (routine testing w rflx)     Status: None   Collection Time: 12/21/21  4:47 PM  Result Value Ref Range   HIV Screen 4th Generation wRfx Non Reactive Non Reactive    Comment: Performed at [facility] [address] [place], [place] [address]  Glucose, capillary  Status: Abnormal   Collection Time: 12/22/21  1:37 AM  Result Value Ref Range   Glucose-Capillary 188 (H) 70 - 99 mg/dL    Comment: Glucose  reference range applies only to samples taken after fasting for at least 8 hours.  Brain natriuretic peptide     Status: Abnormal   Collection Time: 12/22/21  6:26 AM  Result Value Ref Range   B Natriuretic Peptide 136.2 (H) 0.0 - 100.0 pg/mL    Comment: Performed at [facility] [address] [place], [place] [address]  Basic metabolic panel     Status: Abnormal   Collection Time: 12/22/21  6:26 AM  Result Value Ref Range   Sodium 139 135 - 145 mmol/L   Potassium 4.4 3.5 - 5.1 mmol/L   Chloride 100 98 - 111 mmol/L   CO2 29 22 - 32 mmol/L   Glucose, Bld 159 (H) 70 - 99 mg/dL    Comment: Glucose reference range applies only to samples taken after fasting for at least 8 hours.   BUN 49 (H) 6 - 20 mg/dL   Creatinine, Ser 5.87 (H) 0.44 - 1.00 mg/dL   Calcium 8.7 (L) 8.9 - 10.3 mg/dL   GFR, Estimated 8 (L) >60 mL/min    Comment: (NOTE) Calculated using the CKD-EPI Creatinine Equation (2021)    Anion gap 10 5 - 15    Comment: Performed at [facility] [address], [place], [place] [address]  CBC     Status: Abnormal   Collection Time: 12/22/21  6:26 AM  Result Value Ref Range   WBC 8.0 4.0 - 10.5 K/uL   RBC 2.90 (L) 3.87 - 5.11 MIL/uL   Hemoglobin 8.0 (L) 12.0 - 15.0 g/dL   HCT 24.6 (L) 36.0 - 46.0 %   MCV 84.8 80.0 - 100.0 fL   MCH 27.6 26.0 - 34.0 pg   MCHC 32.5 30.0 - 36.0 g/dL   RDW 13.9 11.5 - 15.5 %   Platelets 136 (L) 150 - 400 K/uL   nRBC 0.0 0.0 - 0.2 %    Comment: Performed at [facility], [place] [address], [place], [place] [address]  Type and screen     Status: None   Collection Time: 12/22/21  6:26 AM  Result Value Ref Range   ABO/RH(D) O POS    Antibody Screen NEG    Sample Expiration      12/25/2021,2359 Performed at [facility] [address], [place], [place] [address]     DG Chest Portable 1 View  Result Date: 12/21/2021 CLINICAL DATA:  Chest pain. EXAM: PORTABLE CHEST 1 VIEW COMPARISON:  Most recent available radiograph  03/21/2020 FINDINGS: Tracheostomy tube tip projects over the thoracic inlet. Lung volumes are low. Mild cardiomegaly which is similar to prior exam. Diffuse hazy opacity in the right hemithorax which may represent layering effusion. There is trace fluid in the right minor fissure. No confluent consolidation. No pneumothorax. No acute osseous abnormalities are seen. IMPRESSION: 1. Low lung volumes. Diffuse hazy opacity in the right hemithorax may represent layering effusion. Trace fluid in the right minor fissure. 2. Stable mild cardiomegaly. Electronically Signed   By: Keith Rake M.D.   On: 12/21/2021 01:57    ROS Blood pressure (!) 129/53, pulse 63, temperature 98.3 F (36.8 C), temperature source Oral, resp. rate 14, height 5\' 2"  (1.575 m), weight 77.6 kg, SpO2 98 %. Physical Exam HENT:     Nose:  Comments: dryness of the anterior nose with some of the old blood crusts no active bleeding Eyes:     Pupils: Pupils are equal, round, and reactive to light.  Neurological:     Mental Status: She is alert.      Assessment/Plan: Epistaxis-she will need saline spray irrigation aggressively due to the dryness of the nasal cannulas.  If the Eliquis can be held for a few days that would be beneficial and that is probably a significant issue with the bleeding.  No packing would be best as that will have to be removed so right now with her not currently bleeding observation will be the best therapy. Saline spray to nose BID. She was up at [facility] since yesterday where she was cauterized and  TXA placed. She has not had any bleeding in 24 hours. She can go home from my point and she has an ENT,Dr [place], in [place]. Call if there is any further bleeding.  Melissa Montane 12/22/2021, 7:54 AM

## 2021-12-23 ENCOUNTER — Other Ambulatory Visit (HOSPITAL_COMMUNITY): Payer: Self-pay

## 2021-12-23 DIAGNOSIS — D62 Acute posthemorrhagic anemia: Secondary | ICD-10-CM | POA: Diagnosis not present

## 2021-12-23 LAB — CBC WITH DIFFERENTIAL/PLATELET
Abs Immature Granulocytes: 0.02 10*3/uL (ref 0.00–0.07)
Basophils Absolute: 0 10*3/uL (ref 0.0–0.1)
Basophils Relative: 0 %
Eosinophils Absolute: 0.1 10*3/uL (ref 0.0–0.5)
Eosinophils Relative: 2 %
HCT: 24.4 % — ABNORMAL LOW (ref 36.0–46.0)
Hemoglobin: 7.9 g/dL — ABNORMAL LOW (ref 12.0–15.0)
Immature Granulocytes: 0 %
Lymphocytes Relative: 33 %
Lymphs Abs: 2.3 10*3/uL (ref 0.7–4.0)
MCH: 27 pg (ref 26.0–34.0)
MCHC: 32.4 g/dL (ref 30.0–36.0)
MCV: 83.3 fL (ref 80.0–100.0)
Monocytes Absolute: 0.8 10*3/uL (ref 0.1–1.0)
Monocytes Relative: 12 %
Neutro Abs: 3.6 10*3/uL (ref 1.7–7.7)
Neutrophils Relative %: 53 %
Platelets: 156 10*3/uL (ref 150–400)
RBC: 2.93 MIL/uL — ABNORMAL LOW (ref 3.87–5.11)
RDW: 14 % (ref 11.5–15.5)
WBC: 6.8 10*3/uL (ref 4.0–10.5)
nRBC: 0.3 % — ABNORMAL HIGH (ref 0.0–0.2)

## 2021-12-23 LAB — URINE CULTURE: Culture: <10000 — AB

## 2021-12-23 LAB — COMPREHENSIVE METABOLIC PANEL
ALT: 12 U/L (ref 0–44)
AST: 15 U/L (ref 15–41)
Albumin: 2.9 g/dL — ABNORMAL LOW (ref 3.5–5.0)
Alkaline Phosphatase: 84 U/L (ref 38–126)
Anion gap: 9 (ref 5–15)
BUN: 58 mg/dL — ABNORMAL HIGH (ref 6–20)
CO2: 27 mmol/L (ref 22–32)
Calcium: 8.7 mg/dL — ABNORMAL LOW (ref 8.9–10.3)
Chloride: 103 mmol/L (ref 98–111)
Creatinine, Ser: 6.68 mg/dL — ABNORMAL HIGH (ref 0.44–1.00)
GFR, Estimated: 7 mL/min — ABNORMAL LOW (ref >60)
Glucose, Bld: 148 mg/dL — ABNORMAL HIGH (ref 70–99)
Potassium: 4.4 mmol/L (ref 3.5–5.1)
Sodium: 139 mmol/L (ref 135–145)
Total Bilirubin: 0.5 mg/dL (ref 0.3–1.2)
Total Protein: 8.7 g/dL — ABNORMAL HIGH (ref 6.5–8.1)

## 2021-12-23 LAB — GLUCOSE, CAPILLARY
Glucose-Capillary: 157 mg/dL — ABNORMAL HIGH (ref 70–99)
Glucose-Capillary: 271 mg/dL — ABNORMAL HIGH (ref 70–99)

## 2021-12-23 LAB — MAGNESIUM: Magnesium: 2.2 mg/dL (ref 1.7–2.4)

## 2021-12-23 MED ORDER — CLONIDINE HCL 0.2 MG PO TABS
0.2000 mg | ORAL_TABLET | Freq: Three times a day (TID) | ORAL | 0 refills | Status: AC
  Filled 2021-12-23: qty 90, 30d supply, fill #0

## 2021-12-23 MED ORDER — APIXABAN 5 MG PO TABS: 5.0000 mg | ORAL_TABLET | Freq: Two times a day (BID) | ORAL | Status: AC

## 2021-12-23 MED ORDER — CLONIDINE HCL 0.2 MG PO TABS: 0.2000 mg | ORAL_TABLET | Freq: Three times a day (TID) | ORAL | Status: DC

## 2021-12-23 NOTE — Progress Notes (Signed)
Pt coughed up / snorted out a medium to large blood clot this AM at 0620- pt was noit in distress/ did not feel the sensation of bleeding or the buildup of the clot. Clot is approximately a rounded tablespoon in size, dark red in color- gelatinous/ doughy in texture- no apparent bleeding source in either Nare or in the Oral Cavity. Pt believes it to have "come from her sinus" -

## 2021-12-23 NOTE — Discharge Summary (Signed)
Joy Yoder NIO:[id number] DOB: Apr 27, 1966 DOA: 12/21/2021  PCP: System, Provider Not In  Admit date: 12/21/2021  Discharge date: 12/23/2021  Admitted From: Home   Disposition:  Home   Recommendations for Outpatient Follow-up:   Follow up with PCP in 1-2 weeks  PCP Please obtain BMP/CBC, 2 view CXR in 1week,  (see Discharge instructions)   PCP Please follow up on the following pending results:    Home Health: None   Equipment/Devices: None  Consultations: ENT, Renal Discharge Condition: Stable    CODE STATUS: Full    Diet Recommendation: Renal Low Carb, 1.2 L fluid restriction per day.    Chief Complaint  Patient presents with   Chest Pain    nosebleed     Brief history of present illness from the day of admission and additional interim summary    56 year old female with a history of CKD 5, chronic respiratory failure on 4 L with tracheostomy, hypertension, chronic chest pain, depression, lower extremity DVT, OSA, and CHF presented to [facility], ER with recurrent epistaxis and chest pain, she also had some acute blood loss related anemia.  She was transferred to [facility] for E[facility] evaluation due to her ongoing nosebleed.                                                                 Hospital Course   Acute on chronic blood loss anemia -  Baseline hemoglobin 8-9 due to anemia of chronic disease in the setting of CKD 5, mild drop in H&H, nosebleeds have resolved continue to monitor H&H for another 24 hrs, continue to hold anticoagulation for another 24 hours, PCP to monitor H&H and nosebleed.   History of deep venous thrombosis (DVT) of distal vein of left lower extremity x 2 -  Holding apixaban temporarily in the setting of acute blood loss anemia and epistaxis, will resume on 12/24/2021, patient  informed, case was discussed with ENT Dr. Janace Hoard on 12/22/2021.   Chronic respiratory failure with hypoxia (HCC) -  Chronically on 4 L nasal cannula at home, now at baseline will be discharged back on home nasal cannula oxygen, nasal saline spray also added.   CKD (chronic kidney disease) stage 5, GFR less than 15 ml/min (HCC) - currently no uremic symptoms this morning, nephrology to follow.  She is getting close to ESRD and follows with nephrology in [place].  Patient requested to follow with her PCP and nephrologist within 2 to 3 days.   Epistaxis - in the setting of being on Eliquis chronically and on 4 L nasal cannula oxygen likely causing some nasal drying, nosebleeds seem to have resolved, anticoagulation held.  Case discussed with ENT physician Dr. Janace Hoard.  Continue to monitor.  Patient also has her own ENT physician at [facility].  Nasal saline  spray added.  This problem has resolved.   Atypical chest pain - Appears there is a major musculoskeletal component, chest pain was reproducible in the ER, now resolved.  Troponin negative.  Echocardiogram stable with preserved EF and no wall motion abnormality kindly review full report below.  Chronic diastolic heart failure (HCC) - Appears clinically euvolemic, diuretics per nephrology.   Malignant hypertension - Continue home dose of nifedipine, hydralazine, clonidine, and carvedilol,  There have been concerns regarding the patient's compliance.  At the present dose doubled, counseled on compliance.  PCP to monitor and adjust.   Uncontrolled type 2 diabetes mellitus with hyperglycemia, with long-term current use of insulin (HCC) -  continue home regimen for now but follow-up with PCP on a close basis.  Lab Results  Component Value Date   HGBA1C 7.7 (H) 12/22/2021      Discharge diagnosis     Principal Problem:   Acute on chronic blood loss anemia Active Problems:   Malignant hypertension   Uncontrolled type 2 diabetes mellitus with  hyperglycemia, with long-term current use of insulin (HCC)   Chronic diastolic heart failure (HCC)   Atypical chest pain   Epistaxis   CKD (chronic kidney disease) stage 5, GFR less than 15 ml/min (HCC)   Chronic respiratory failure with hypoxia (HCC)   History of deep venous thrombosis (DVT) of distal vein of left lower extremity    Discharge instructions    Discharge Instructions     Discharge instructions   Complete by: As directed    Follow with Primary MD family ENT physician in 7 days   Get CBC, CMP, UA -  checked next visit within 1 week by Primary MD    Activity: As tolerated with Full fall precautions use walker/cane & assistance as needed  Disposition Home    Diet: Renal-low carbohydrate diet with 1200 cc fluid restriction per day  Special Instructions: If you have smoked or chewed Tobacco  in the last 2 yrs please stop smoking, stop any regular Alcohol  and or any Recreational drug use.  On your next visit with your primary care physician please Get Medicines reviewed and adjusted.  Please request your Prim.MD to go over all Hospital Tests and Procedure/Radiological results at the follow up, please get all Hospital records sent to your Prim MD by signing hospital release before you go home.  If you experience worsening of your admission symptoms, develop shortness of breath, life threatening emergency, suicidal or homicidal thoughts you must seek medical attention immediately by calling 911 or calling your MD immediately  if symptoms less severe.  You Must read complete instructions/literature along with all the possible adverse reactions/side effects for all the Medicines you take and that have been prescribed to you. Take any new Medicines after you have completely understood and accpet all the possible adverse reactions/side effects.   Increase activity slowly   Complete by: As directed        Discharge Medications   Allergies as of 12/23/2021       Reactions    Morphine Shortness Of Breath   Metformin And Related Diarrhea   Nsaids    Kidney disease   Pravastatin Swelling   Reglan [metoclopramide]    shakes   Tramadol Itching   Vancomycin Other (See Comments)   Kidney failure Kidney failure        Medication List     STOP taking these medications    amitriptyline 25 MG tablet Commonly known as: ELAVIL  amLODipine 5 MG tablet Commonly known as: NORVASC   predniSONE 20 MG tablet Commonly known as: Deltasone       TAKE these medications    albuterol 108 (90 Base) MCG/ACT inhaler Commonly known as: VENTOLIN HFA Inhale 2 puffs into the lungs every 6 (six) hours.   albuterol (2.5 MG/3ML) 0.083% nebulizer solution Commonly known as: PROVENTIL Take 2.5 mg by nebulization 3 (three) times daily as needed.   apixaban 5 MG Tabs tablet Commonly known as: ELIQUIS Take 1 tablet (5 mg total) by mouth 2 (two) times daily. Start taking on: Dec 24, 2021   atorvastatin 80 MG tablet Commonly known as: LIPITOR Take 80 mg by mouth daily at 6 PM.   Biotin 10 MG Tabs Take by mouth.   calcitRIOL 0.25 MCG capsule Commonly known as: ROCALTROL Take 0.25 mcg by mouth daily.   carvedilol 25 MG tablet Commonly known as: COREG Take 25 mg by mouth 2 (two) times daily with a meal.   cephALEXin 500 MG capsule Commonly known as: KEFLEX Take 500 mg by mouth 2 (two) times daily.   Cholecalciferol 125 MCG (5000 UT) capsule Take by mouth.   cloNIDine 0.2 MG tablet Commonly known as: CATAPRES Take 1 tablet (0.2 mg total) by mouth 3 (three) times daily. What changed:  medication strength how much to take   clopidogrel 75 MG tablet Commonly known as: PLAVIX Take 1 tablet (75 mg total) by mouth daily.   cyanocobalamin 1000 MCG tablet Take by mouth.   cyclobenzaprine 10 MG tablet Commonly known as: FLEXERIL Take 10 mg by mouth 3 (three) times daily as needed for muscle spasms.   dextromethorphan-guaiFENesin 30-600 MG 12hr  tablet Commonly known as: MUCINEX DM Take 1 tablet by mouth 2 (two) times daily.   diclofenac Sodium 1 % Gel Commonly known as: VOLTAREN Apply topically.   dicyclomine 10 MG capsule Commonly known as: BENTYL Take 10 mg by mouth daily as needed for spasms.   furosemide 20 MG tablet Commonly known as: LASIX Take 40 mg by mouth in the morning.   gabapentin 600 MG tablet Commonly known as: NEURONTIN Take 300 mg by mouth 3 (three) times daily.   hydrALAZINE 100 MG tablet Commonly known as: APRESOLINE Take 100 mg by mouth 3 (three) times daily.   insulin lispro 100 UNIT/ML injection Commonly known as: HUMALOG Inject 5-16 Units into the skin 3 (three) times daily before meals. Patient injects according to sliding scale at home.   ipratropium-albuterol 0.5-2.5 (3) MG/3ML Soln Commonly known as: DUONEB Inhale 3 mLs into the lungs 3 (three) times daily as needed.   isosorbide mononitrate 30 MG 24 hr tablet Commonly known as: IMDUR Take 30 mg by mouth 3 (three) times daily.   Lantus SoloStar 100 UNIT/ML Solostar Pen Generic drug: insulin glargine Inject 50 Units into the skin at bedtime.   lidocaine 5 % Commonly known as: LIDODERM 1 patch daily.   LORazepam 1 MG tablet Commonly known as: ATIVAN Take 1 mg by mouth 2 (two) times daily.   metolazone 5 MG tablet Commonly known as: ZAROXOLYN Take by mouth.   mirtazapine 15 MG tablet Commonly known as: REMERON Take 15 mg by mouth at bedtime as needed.   NIFEdipine 90 MG 24 hr tablet Commonly known as: ADALAT CC Take 90 mg by mouth daily.   ondansetron 4 MG disintegrating tablet Commonly known as: ZOFRAN-ODT Take 1 tablet by mouth daily as needed.   OneTouch Delica Lancets 76O Misc Apply 1 each  topically in the morning and at bedtime.   OneTouch Verio test strip Generic drug: glucose blood 1 each by Other route in the morning and at bedtime.   pantoprazole 40 MG tablet Commonly known as: PROTONIX Take 40 mg by  mouth in the morning.   sevelamer 800 MG tablet Commonly known as: RENAGEL Take 800 mg by mouth 3 (three) times daily with meals.   venlafaxine XR 150 MG 24 hr capsule Commonly known as: EFFEXOR-XR Take 150 mg by mouth every evening.   zinc gluconate 50 MG tablet Take by mouth.         Follow-up Information     With your primary MD and your ENT physician. Schedule an appointment as soon as possible for a visit in 1 week(s).                  Major procedures and Radiology Reports - PLEASE review detailed and final reports thoroughly  -      DG Chest Portable 1 View  Result Date: 12/21/2021 CLINICAL DATA:  Chest pain. EXAM: PORTABLE CHEST 1 VIEW COMPARISON:  Most recent available radiograph 03/21/2020 FINDINGS: Tracheostomy tube tip projects over the thoracic inlet. Lung volumes are low. Mild cardiomegaly which is similar to prior exam. Diffuse hazy opacity in the right hemithorax which may represent layering effusion. There is trace fluid in the right minor fissure. No confluent consolidation. No pneumothorax. No acute osseous abnormalities are seen. IMPRESSION: 1. Low lung volumes. Diffuse hazy opacity in the right hemithorax may represent layering effusion. Trace fluid in the right minor fissure. 2. Stable mild cardiomegaly. Electronically Signed   By: Keith Rake M.D.   On: 12/21/2021 01:57   ECHOCARDIOGRAM COMPLETE  Result Date: 12/22/2021    ECHOCARDIOGRAM REPORT   Patient Name:   Joy Yoder Date of Exam: 12/22/2021 Medical Rec #:  [id number]               Height:       62.0 in Accession #:    [id number]              Weight:       171.1 lb Date of Birth:  09/28/1965               BSA:          1.789 m Patient Age:    57 years                BP:           152/61 mmHg Patient Gender: F                       HR:           62 bpm. Exam Location:  Inpatient Procedure: 2D Echo, Cardiac Doppler and Color Doppler Indications:    Chest pain  History:        Patient  has prior history of Echocardiogram examinations, most                 recent 03/20/2020. COPD; Risk Factors:Diabetes and Hypertension.  Sonographer:    Jefferey Pica Referring Phys: [place]  1. Left ventricular ejection fraction, by estimation, is 65 to 70%. The left ventricle has normal function. The left ventricle has no regional wall motion abnormalities. There is severe asymmetric left ventricular hypertrophy of the basal-septal segment. Left ventricular diastolic parameters are indeterminate.  2. Right ventricular systolic function is  normal. The right ventricular size is normal. Tricuspid regurgitation signal is inadequate for assessing PA pressure.  3. The mitral valve is normal in structure. No evidence of mitral valve regurgitation. No evidence of mitral stenosis.  4. The aortic valve is tricuspid. Aortic valve regurgitation is not visualized. No aortic stenosis is present. FINDINGS  Left Ventricle: Left ventricular ejection fraction, by estimation, is 65 to 70%. The left ventricle has normal function. The left ventricle has no regional wall motion abnormalities. The left ventricular internal cavity size was normal in size. There is  severe asymmetric left ventricular hypertrophy of the basal-septal segment. Left ventricular diastolic parameters are indeterminate. Right Ventricle: The right ventricular size is normal. No increase in right ventricular wall thickness. Right ventricular systolic function is normal. Tricuspid regurgitation signal is inadequate for assessing PA pressure. Left Atrium: Left atrial size was normal in size. Right Atrium: Right atrial size was normal in size. Pericardium: There is no evidence of pericardial effusion. Mitral Valve: The mitral valve is normal in structure. No evidence of mitral valve regurgitation. No evidence of mitral valve stenosis. Tricuspid Valve: The tricuspid valve is normal in structure. Tricuspid valve regurgitation is trivial.  Aortic Valve: The aortic valve is tricuspid. Aortic valve regurgitation is not visualized. No aortic stenosis is present. Aortic valve peak gradient measures 11.2 mmHg. Pulmonic Valve: The pulmonic valve was not well visualized. Pulmonic valve regurgitation is trivial. Aorta: The aortic root and ascending aorta are structurally normal, with no evidence of dilitation. IAS/Shunts: The interatrial septum was not well visualized.  LEFT VENTRICLE PLAX 2D LVIDd:         4.90 cm   Diastology LVIDs:         2.60 cm   LV e' medial:    5.08 cm/s LV PW:         1.30 cm   LV E/e' medial:  16.1 LV IVS:        1.30 cm   LV e' lateral:   7.45 cm/s LVOT diam:     1.90 cm   LV E/e' lateral: 11.0 LV SV:         90 LV SV Index:   50 LVOT Area:     2.84 cm  RIGHT VENTRICLE TAPSE (M-mode): 2.0 cm LEFT ATRIUM             Index        RIGHT ATRIUM           Index LA diam:        4.00 cm 2.24 cm/m   RA Area:     13.30 cm LA Vol (A2C):   57.9 ml 32.37 ml/m  RA Volume:   31.20 ml  17.44 ml/m LA Vol (A4C):   56.2 ml 31.42 ml/m LA Biplane Vol: 57.8 ml 32.31 ml/m  AORTIC VALVE                 PULMONIC VALVE AV Area (Vmax): 2.50 cm     PV Vmax:       1.14 m/s AV Vmax:        167.00 cm/s  PV Peak grad:  5.2 mmHg AV Peak Grad:   11.2 mmHg LVOT Vmax:      147.00 cm/s LVOT Vmean:     91.100 cm/s LVOT VTI:       0.316 m  AORTA Ao Root diam: 3.10 cm Ao Asc diam:  2.50 cm MITRAL VALVE MV Area (PHT): 3.53 cm    SHUNTS MV  Decel Time: 215 msec    Systemic VTI:  0.32 m MV E velocity: 82.00 cm/s  Systemic Diam: 1.90 cm MV A velocity: 79.20 cm/s MV E/A ratio:  1.04 Oswaldo Milian MD Electronically signed by Oswaldo Milian MD Signature Date/Time: 12/22/2021/1:14:48 PM    Final        Today   Subjective    Joy Yoder today has no headache,no chest abdominal pain,no new weakness tingling or numbness, feels much better wants to go home today.     Objective   Blood pressure (!) 187/74, pulse 80, temperature 98.5 F (36.9  C), temperature source Oral, resp. rate (!) 22, height 5\' 2"  (1.575 m), weight 77.6 kg, SpO2 98 %.   Intake/Output Summary (Last 24 hours) at 12/23/2021 0902 Last data filed at 12/23/2021 0700 Gross per 24 hour  Intake 100 ml  Output 1550 ml  Net -1450 ml    Exam  Awake Alert, No new F.N deficits,    [place].AT,PERRAL, trach site stable Supple Neck,   Symmetrical Chest wall movement, Good air movement bilaterally, CTAB RRR,No Gallops,   +ve B.Sounds, Abd Soft, Non tender,  No Cyanosis, Clubbing or edema    Data Review   CBC w Diff:  Lab Results  Component Value Date   WBC 6.8 12/23/2021   HGB 7.9 (L) 12/23/2021   HCT 24.4 (L) 12/23/2021   PLT 156 12/23/2021   LYMPHOPCT 33 12/23/2021   MONOPCT 12 12/23/2021   EOSPCT 2 12/23/2021   BASOPCT 0 12/23/2021    CMP:  Lab Results  Component Value Date   NA 139 12/23/2021   K 4.4 12/23/2021   CL 103 12/23/2021   CO2 27 12/23/2021   BUN 58 (H) 12/23/2021   CREATININE 6.68 (H) 12/23/2021   PROT 8.7 (H) 12/23/2021   ALBUMIN 2.9 (L) 12/23/2021   BILITOT 0.5 12/23/2021   ALKPHOS 84 12/23/2021   AST 15 12/23/2021   ALT 12 12/23/2021  .   Total Time in preparing paper work, data evaluation and todays exam - 4 minutes  Lala Lund M.D on 12/23/2021 at 9:02 AM  [facility]

## 2021-12-23 NOTE — TOC Initial Note (Signed)
Transition of Care [facility]) - Initial/Assessment Note    Patient Details  Name: Joy Yoder MRN: [id number] Date of Birth: [birth year]  [facility] CM/SW Contact:    Zenon Mayo, RN Phone Number: 12/23/2021, 11:12 AM  Clinical Narrative:                 From home, she states her daughter will transport her home today.  She states she does not want HHPT  at this time, she is in the process of moving right now.  [facility] [place] [facility] and she has an apt on Wed.    Expected Discharge Plan: [place] Barriers to Discharge: No Barriers Identified   Patient Goals and CMS Choice Patient states their goals for this hospitalization and ongoing recovery are:: return home   Choice offered to / list presented to : NA  Expected Discharge Plan and Services Expected Discharge Plan: [place] In-house Referral: NA Discharge Planning Services: CM Consult Post Acute Care Choice: NA Living arrangements for the past 2 months: Single Family Home Expected Discharge Date: 12/23/21                         HH Arranged: Refused HH          Prior Living Arrangements/Services Living arrangements for the past 2 months: Single Family Home   Patient language and need for interpreter reviewed:: Yes Do you feel safe going back to the place where you live?: Yes      Need for Family Participation in Patient Care: Yes (Comment) Care giver support system in place?: Yes (comment)   Criminal Activity/Legal Involvement Pertinent to Current Situation/Hospitalization: No - Comment as needed  Activities of Daily Living      Permission Sought/Granted                  Emotional Assessment   Attitude/Demeanor/Rapport: Engaged Affect (typically observed): Appropriate Orientation: : Oriented to  Time, Oriented to Situation, Oriented to Place, Oriented to Self Alcohol / Substance Use: Not Applicable Psych  Involvement: No (comment)  Admission diagnosis:  Epistaxis [R04.0] Chronic anemia [D64.9] Pleurisy [R09.1] Acute on chronic blood loss anemia [D62] Patient Active Problem List   Diagnosis Date Noted   Acute on chronic blood loss anemia 12/21/2021   Epistaxis 12/21/2021   CKD (chronic kidney disease) stage 5, GFR less than 15 ml/min (HCC) 12/21/2021   Chronic respiratory failure with hypoxia ([place]) 12/21/2021   History of deep venous thrombosis (DVT) of distal vein of left lower extremity 12/21/2021   Community acquired pneumonia of left lower lobe of lung    Aspiration pneumonia ([place]) 03/19/2020   Abdominal pain 03/19/2020   Constipation 03/19/2020   Sepsis ([place]) 03/19/2020   Symptomatic anemia 11/10/2019   Hyperglycemia due to diabetes mellitus ([place]) 11/10/2019   Atypical chest pain 11/10/2019   Acute on chronic respiratory failure with hypoxia (HCC)    Chronic diastolic heart failure (HCC)    Chronic kidney disease, stage III (moderate)    COPD, severe ([place])    Lobar pneumonia ([place])    AKI (acute kidney injury) ([place]) 07/07/2017   Chronic pain syndrome 07/07/2017   Nausea & vomiting 07/06/2017   Intractable pain 07/04/2017   Malignant hypertension 07/04/2017   Uncontrolled type 2 diabetes mellitus with hyperglycemia, with long-term current use of insulin ([place]) 07/04/2017   PCP:  System, Provider Not In  Pharmacy:   [facility] [id number] - [place], [place] [address] Phone: [phone] Fax: [phone]  [place], [place] [address] [place] [address] Phone: [phone] Fax: [place] [address] [place] [address] Phone: [phone] Fax: [phone]     Social Determinants of Health (SDOH) Interventions    Readmission Risk Interventions    12/23/2021   11:10 AM 03/21/2020   10:55 AM   Readmission Risk Prevention Plan  Transportation Screening Complete Complete  PCP or Specialist Appt within 3-5 Days Complete   HRI or [place] Complete   Social Work Consult for [place] Planning/Counseling Complete   Palliative Care Screening Not Applicable   Medication Review Press photographer) Complete Complete  SW Recovery Care/Counseling Consult  Complete

## 2021-12-23 NOTE — Plan of Care (Signed)
                                      [place]                            [address] [place], [place] [address]      Please excuse Miss  Joy Yoder, DOB  daughter of Miss Yolanda Huffstetler from work on 12/23/21, her mother was admitted to the Hospital on 12/21/2021 and Discharged  12/23/2021 Miss  Joy Yoder is assisting in her mothers discharge from the hospital on 12/23/21.  Call Lala Lund MD, [facility]  [phone] with questions.  Lala Lund M.D on 12/23/2021,at 12:20 PM  [facility]   Office  [phone]

## 2021-12-23 NOTE — Progress Notes (Addendum)
Joy Yoder is alert and oriented x4. No complaints of pain. Breathing is even and nonlabored. Educated pt and daughter on discharge instructions, both stated their understanding. Assisted pt in getting dressed, removed PIV with no issues. Medications received from pharmacy. Pt belongings taken with daughter. Staff assisted pt to w/c to be transported to private family vehicle.

## 2021-12-23 NOTE — TOC Transition Note (Signed)
Transition of Care [facility]) - CM/SW Discharge Note   Patient Details  Name: Joy Yoder MRN: [id number] Date of Birth: [birth year]  Transition of Care [facility]) CM/SW Contact:  Zenon Mayo, RN Phone Number: 12/23/2021, 11:13 AM   Clinical Narrative:    From home, she states her daughter will transport her home today.  She states she does not want HHPT  at this time, she is in the process of moving right now.  She has [facility] in [place] [facility] and she has an apt on Wed.      Final next level of care: Home/Self Care Barriers to Discharge: No Barriers Identified   Patient Goals and CMS Choice Patient states their goals for this hospitalization and ongoing recovery are:: return home   Choice offered to / list presented to : NA  Discharge Placement                       Discharge Plan and Services In-house Referral: NA Discharge Planning Services: CM Consult Post Acute Care Choice: NA                    HH Arranged: Refused HH          Social Determinants of Health (SDOH) Interventions     Readmission Risk Interventions    12/23/2021   11:10 AM 03/21/2020   10:55 AM  Readmission Risk Prevention Plan  Transportation Screening Complete Complete  PCP or Specialist Appt within 3-5 Days Complete   HRI or [place] Complete   Social Work Consult for [place] Planning/Counseling Complete   Palliative Care Screening Not Applicable   Medication Review Press photographer) Complete Complete  SW Recovery Care/Counseling Consult  Complete

## 2021-12-24 LAB — UREA NITROGEN, URINE: Urea Nitrogen, Ur: 358 mg/dL

## 2022-08-01 IMAGING — DX DG CHEST 1V PORT
1 series · 1 of 1 positions shown · non-contrast
Comparison: Most recent available radiograph 03/21/2020

CLINICAL DATA: Chest pain.

EXAM:
PORTABLE CHEST 1 VIEW

[chest ap]
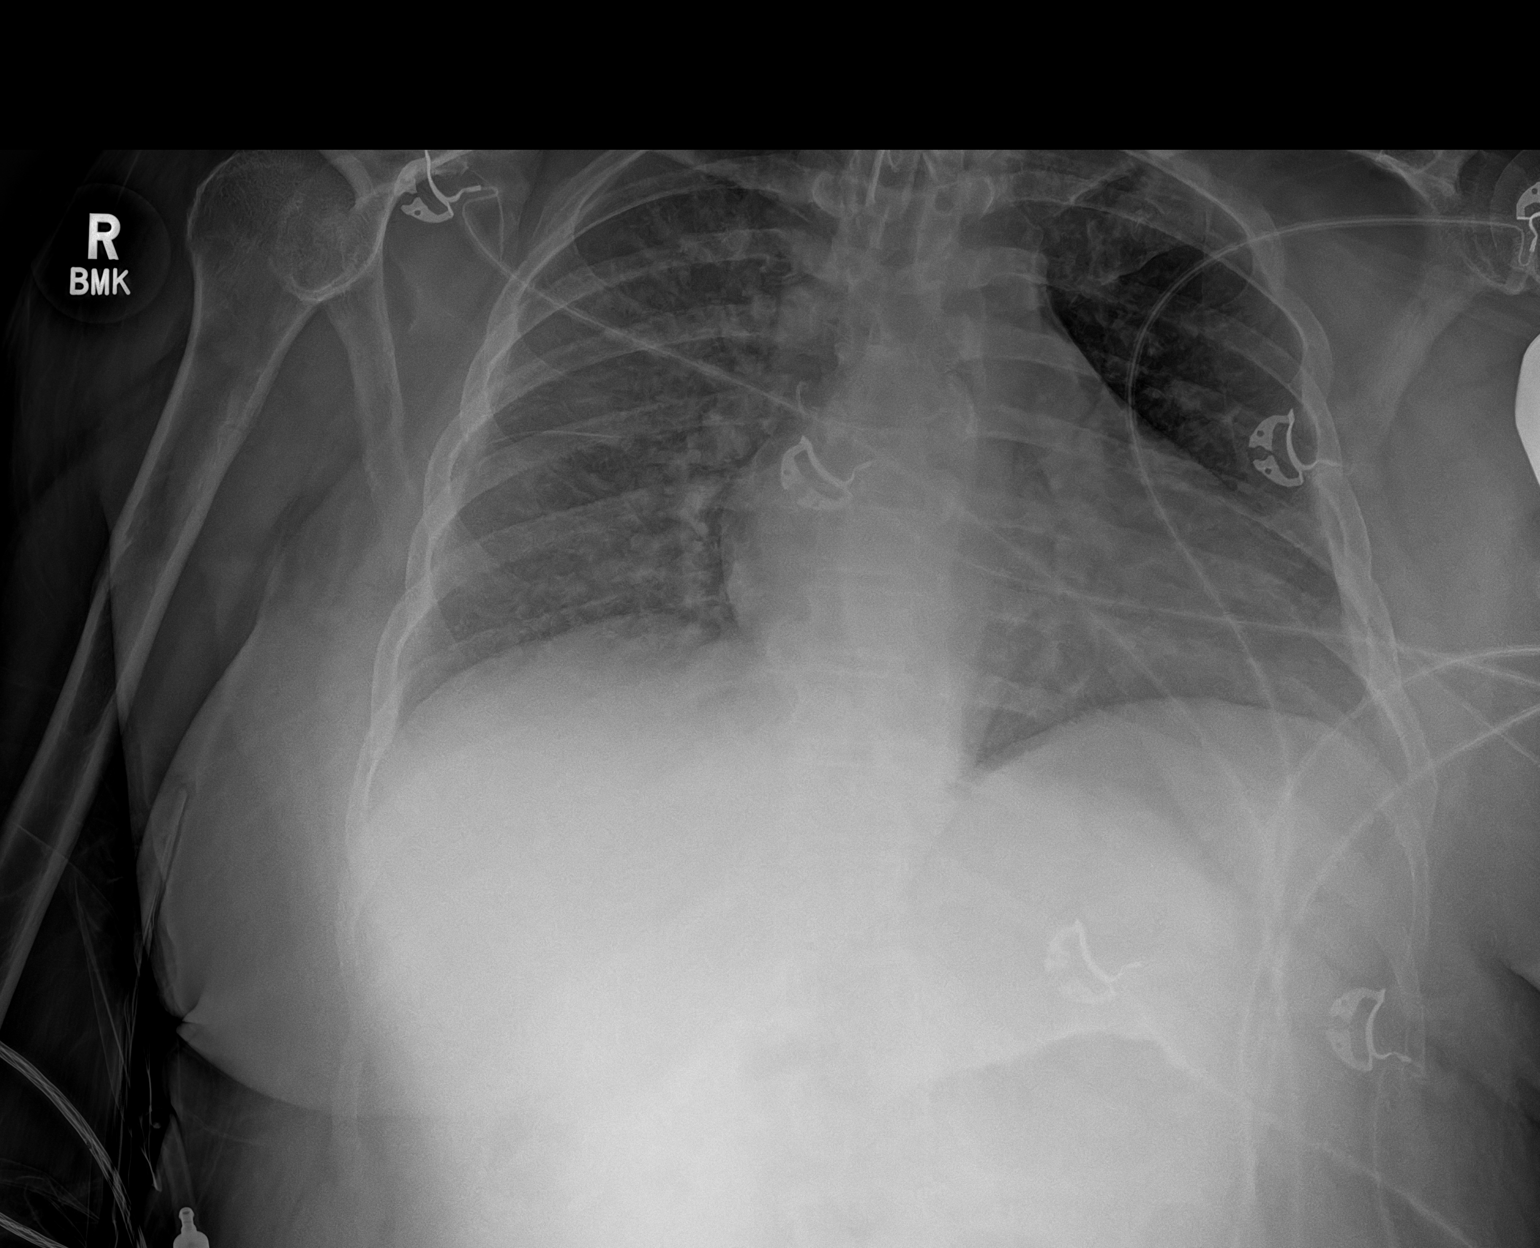

[1 of 1 positions shown; findings below may reference images not displayed]

FINDINGS: Tracheostomy tube tip projects over the thoracic inlet. Lung volumes
are low. Mild cardiomegaly which is similar to prior exam. Diffuse
hazy opacity in the right hemithorax which may represent layering
effusion. There is trace fluid in the right minor fissure. No
confluent consolidation. No pneumothorax. No acute osseous
abnormalities are seen.
IMPRESSION: 1. Low lung volumes. Diffuse hazy opacity in the right hemithorax
may represent layering effusion. Trace fluid in the right minor
fissure.
2. Stable mild cardiomegaly.

## 2024-05-30 ENCOUNTER — Other Ambulatory Visit: Payer: Self-pay
